# Patient Record
Sex: Female | Born: 1960 | Race: White | Hispanic: No | Marital: Married | Smoking: Never smoker
Health system: Southern US, Community
[De-identification: ages and names within clinical notes are randomized; demographics above are authoritative.]

## PROBLEM LIST (undated history)

## (undated) DIAGNOSIS — I10 Essential (primary) hypertension: Secondary | ICD-10-CM

## (undated) DIAGNOSIS — E079 Disorder of thyroid, unspecified: Secondary | ICD-10-CM

---

## 1999-08-05 ENCOUNTER — Encounter: Payer: Self-pay | Admitting: Family Medicine

## 1999-08-05 ENCOUNTER — Encounter: Admission: RE | Admit: 1999-08-05 | Discharge: 1999-08-05 | Payer: Self-pay | Admitting: Family Medicine

## 2000-11-13 ENCOUNTER — Other Ambulatory Visit: Admission: RE | Admit: 2000-11-13 | Discharge: 2000-11-13 | Payer: Self-pay | Admitting: Family Medicine

## 2002-02-22 ENCOUNTER — Encounter: Admission: RE | Admit: 2002-02-22 | Discharge: 2002-02-22 | Payer: Self-pay | Admitting: Orthopedic Surgery

## 2002-02-22 ENCOUNTER — Encounter: Payer: Self-pay | Admitting: Orthopedic Surgery

## 2002-03-08 ENCOUNTER — Encounter: Payer: Self-pay | Admitting: Orthopedic Surgery

## 2002-03-08 ENCOUNTER — Encounter: Admission: RE | Admit: 2002-03-08 | Discharge: 2002-03-08 | Payer: Self-pay | Admitting: Orthopedic Surgery

## 2002-03-22 ENCOUNTER — Encounter: Payer: Self-pay | Admitting: Orthopedic Surgery

## 2002-03-22 ENCOUNTER — Encounter: Admission: RE | Admit: 2002-03-22 | Discharge: 2002-03-22 | Payer: Self-pay | Admitting: Orthopedic Surgery

## 2002-11-20 ENCOUNTER — Encounter: Payer: Self-pay | Admitting: Orthopedic Surgery

## 2002-11-20 ENCOUNTER — Encounter: Admission: RE | Admit: 2002-11-20 | Discharge: 2002-11-20 | Payer: Self-pay | Admitting: Orthopedic Surgery

## 2002-12-06 ENCOUNTER — Encounter: Admission: RE | Admit: 2002-12-06 | Discharge: 2002-12-06 | Payer: Self-pay | Admitting: Orthopedic Surgery

## 2002-12-06 ENCOUNTER — Encounter: Payer: Self-pay | Admitting: Orthopedic Surgery

## 2002-12-20 ENCOUNTER — Encounter: Payer: Self-pay | Admitting: Orthopedic Surgery

## 2002-12-20 ENCOUNTER — Encounter: Admission: RE | Admit: 2002-12-20 | Discharge: 2002-12-20 | Payer: Self-pay | Admitting: Orthopedic Surgery

## 2003-02-28 ENCOUNTER — Other Ambulatory Visit: Admission: RE | Admit: 2003-02-28 | Discharge: 2003-02-28 | Payer: Self-pay | Admitting: Obstetrics and Gynecology

## 2003-12-19 ENCOUNTER — Encounter: Admission: RE | Admit: 2003-12-19 | Discharge: 2003-12-19 | Payer: Self-pay | Admitting: Orthopedic Surgery

## 2004-01-09 ENCOUNTER — Encounter: Admission: RE | Admit: 2004-01-09 | Discharge: 2004-01-09 | Payer: Self-pay | Admitting: Orthopedic Surgery

## 2004-01-23 ENCOUNTER — Encounter: Admission: RE | Admit: 2004-01-23 | Discharge: 2004-01-23 | Payer: Self-pay | Admitting: Orthopedic Surgery

## 2005-02-07 ENCOUNTER — Ambulatory Visit: Payer: Self-pay | Admitting: Family Medicine

## 2005-10-21 ENCOUNTER — Ambulatory Visit (HOSPITAL_COMMUNITY): Admission: RE | Admit: 2005-10-21 | Discharge: 2005-10-22 | Payer: Self-pay | Admitting: Orthopaedic Surgery

## 2006-03-07 ENCOUNTER — Ambulatory Visit: Payer: Self-pay

## 2007-01-23 ENCOUNTER — Other Ambulatory Visit: Admission: RE | Admit: 2007-01-23 | Discharge: 2007-01-23 | Payer: Self-pay | Admitting: Obstetrics and Gynecology

## 2007-02-08 ENCOUNTER — Ambulatory Visit: Payer: Self-pay | Admitting: Family Medicine

## 2007-02-08 DIAGNOSIS — F17200 Nicotine dependence, unspecified, uncomplicated: Secondary | ICD-10-CM

## 2007-03-08 ENCOUNTER — Ambulatory Visit: Payer: Self-pay

## 2007-03-08 ENCOUNTER — Encounter (INDEPENDENT_AMBULATORY_CARE_PROVIDER_SITE_OTHER): Payer: Self-pay | Admitting: Internal Medicine

## 2007-10-08 IMAGING — CR DG LUMBAR SPINE 1V
1 series · 1 of 1 positions shown · non-contrast
Comparison: None.

CLINICAL DATA: L5-S1 microdiscectomy for a left-sided disc herniation.

LUMBAR SPINE - SINGLE PORTABLE CROSS TABLE LATERAL VIEW

[view not recorded]
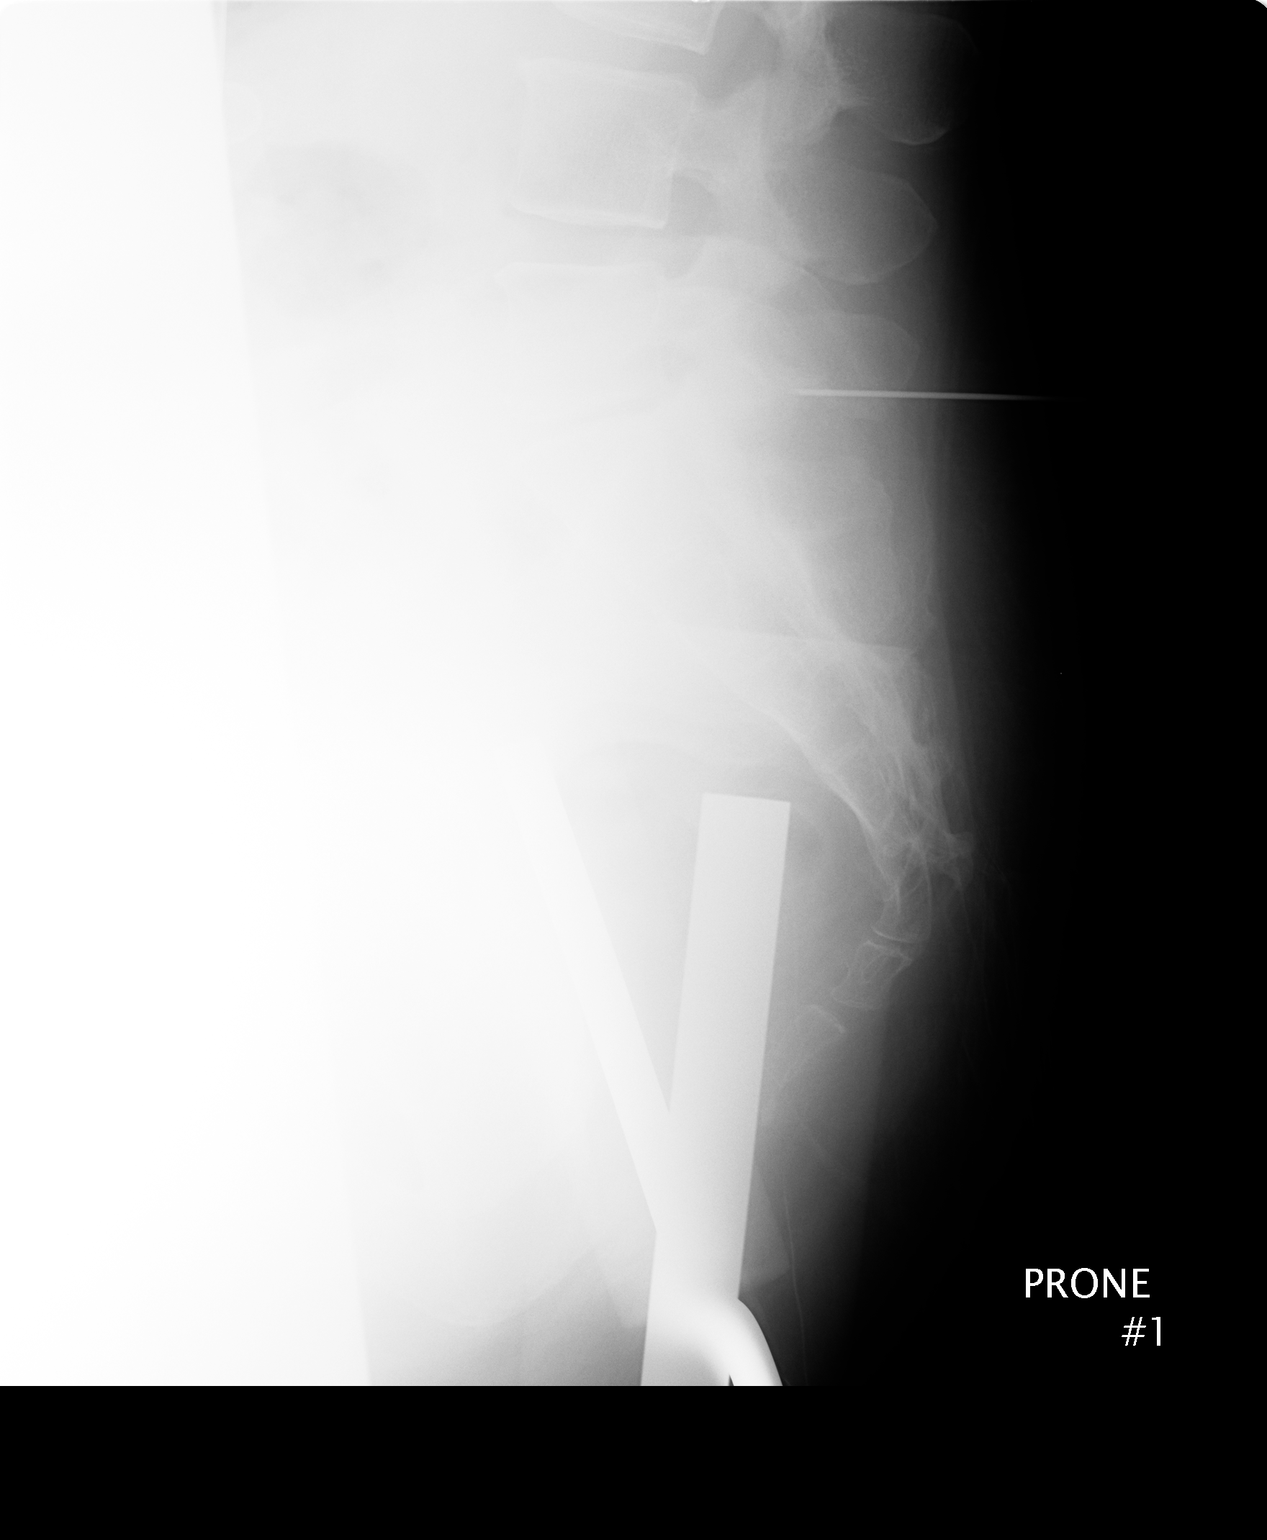

[1 of 1 positions shown; findings below may reference images not displayed]

FINDINGS: Metallic localizer with its tip projected at the posterior aspects of
the facets at the lumbosacral junction. This disc space is moderately to
markedly narrowed. Mild anterior spur formation in the lower lumbar spine.

IMPRESSION

Localizer at the level of a moderately to markedly narrowed disc space at the
lumbosacral junction. Correlation with previous studies would be necessary to
confirm that this is the L5-S1 level.

## 2008-01-24 ENCOUNTER — Other Ambulatory Visit: Admission: RE | Admit: 2008-01-24 | Discharge: 2008-01-24 | Payer: Self-pay | Admitting: Obstetrics and Gynecology

## 2009-11-23 ENCOUNTER — Encounter (INDEPENDENT_AMBULATORY_CARE_PROVIDER_SITE_OTHER): Payer: Self-pay | Admitting: *Deleted

## 2010-03-13 ENCOUNTER — Emergency Department: Payer: Self-pay | Admitting: Emergency Medicine

## 2010-05-20 NOTE — Letter (Signed)
Summary: Nadara Eaton letter  [place] at [facility]  [address] [place], [place] [address]   Phone: [phone]  Fax: [phone]       11/23/2009 MRN: [id number]  [facility] [address] [place], [place]  [address]  Dear Ms. [facility] [place] and [place] announce the retirement of Arta Silence, M.D., from full-time practice at the [facility] office effective October 15, 2009 and his plans of returning part-time.  It is important to Dr. Hetty Ely and to our practice that you understand that [facility] has seven physicians in our office for your health care needs.  We will continue to offer the same exceptional care that you have today.    Dr. Hetty Ely has spoken to many of you about his plans for retirement and returning part-time in the fall.   We will continue to work with you through the transition to schedule appointments for you in the office and meet the high standards that [facility] is committed to.   Again, it is with great pleasure that we share the news that Dr. Hetty Ely will return to [facility] in October of 2011 with a reduced schedule.    If you have any questions, or would like to request an appointment with one of our physicians, please call us at [phone] and press the option for Scheduling an appointment.  We take pleasure in providing you with excellent patient care and look forward to seeing you at your next office visit.  Our [facility] Physicians are:  Tillman Abide, M.D. Laurita Quint, M.D. Roxy Manns, M.D. Kerby Nora, M.D. Hannah Beat, M.D. Ruthe Mannan, M.D. We proudly welcomed Raechel Ache, M.D. and Eustaquio Boyden, M.D. to the practice in July/August 2011.  Sincerely,  [facility]

## 2010-09-03 NOTE — Op Note (Signed)
NAMEJERICHO, [name]              ACCOUNT NO.:  [id number]   MEDICAL RECORD NO.:  [id number]          PATIENT TYPE:  AMB   LOCATION:  SDS                          FACILITY:  [facility]   PHYSICIAN:  Mark C. Ophelia Charter, M.D.    DATE OF BIRTH:  [birth year]   DATE OF PROCEDURE:  10/21/2005  DATE OF DISCHARGE:                                 OPERATIVE REPORT   PREOPERATIVE DIAGNOSIS:  Left L5-S1 herniated nucleus pulposus with  radiculopathy.   POSTOPERATIVE DIAGNOSIS:  Left L5-S1 herniated nucleus pulposus with  radiculopathy.   PROCEDURE:  Left L5-S1 microdiskectomy.   SURGEON:  Mark C. Ophelia Charter, M.D.   ASSISTANT:  Patrick Jupiter, R.N.F.A.   ANESTHESIA:  GOT plus Marcaine skin local.   INDICATIONS FOR PROCEDURE:  This 50 year old female has been followed for  several years for left L5-S1 HNP with persistent symptoms.  She was  scheduled for surgery at one point more than a year ago and then had some  improvement in her symptoms, and surgical intervention was delayed.  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   DESCRIPTION OF PROCEDURE:  After induction of general anesthesia and  preoperative vancomycin, standard prepping with DuraPrep, the area was  covered with towels and Betadine and Vi-Drape applied.  Needle localization  with spinal needle.  Cross-table lateral x-ray confirmed the needle at the  L5-S1 disk space.   An incision was made a few millimeters to the left of the midline.  Subperiosteal dissection on the lamina with self-retaining retractor placed.  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  About two-thirds of  the lamina of L5 on the left had to be removed.  Bone was removed down to  the pedicle before the edge of the S1 lamina bone was found and then  gradually  moved from cephalad to caudad, revealing the dura and the nerve  root as it hooked around the pedicle and out the foramina.  The nerve root  was stuck down anteriorly to bulging disk.  The disk space itself was very  narrow and only allowed a micropituitary entry.  There was calcified disk  fragment present, and it could not be entered with the scalpel.  Microcurettes were used to pull up at the edge starting from cephalad to  caudad.  Once the edge was pulled up, a 10-mm Kerrison was inserted, and the  bone shell was nibbled away.  Underneath was disk herniation with protruding  disk underneath.  Epstein curettes had to be used to break the shell out at  the midline and push it into the narrowed disk space and then grasped with a  micropituitary.  Continued dissection continued until disk space was well-  decompressed.  The old calcified disk shell was removed, and the underlying  fragments were removed that extended caudally.  The disk space itself was  cleaned out with micropituitaries up and down  as well as straight.  There  was minimal disk material centrally remaining since most of it had extruded  underneath the old calcified fragment.  Once a hockey stick could be swept  underneath anteriorly, the foramina was opened.  The disk space was  irrigated.  The operative microscope that had been used for the  microdissection was removed.  Irrigation and closure of the fascia  with 0 Vicryl, 2-0 Vicryl, subcutaneous tissue reapproximation, and 4-0  Vicryl subcuticular closure.  Marcaine infiltration of the skin once again.  Postoperative dressing.   Transferred to the recovery room in stable condition.  Instrument count and  needle count was correct.      Mark C. Ophelia Charter, M.D.  Electronically Signed     MCY/MEDQ  D:  10/21/2005  T:  10/21/2005  Job:  [id number]

## 2013-11-04 ENCOUNTER — Ambulatory Visit: Payer: Self-pay | Admitting: Surgery

## 2013-11-13 ENCOUNTER — Ambulatory Visit: Payer: Self-pay | Admitting: Surgery

## 2013-11-15 LAB — PATHOLOGY REPORT

## 2014-03-17 ENCOUNTER — Ambulatory Visit: Payer: Self-pay | Admitting: Family Medicine

## 2014-08-09 NOTE — Op Note (Signed)
PATIENT NAME:  Amanda DeutscherHALL, Shunna K MR#:  [id number] DATE OF BIRTH:  08/20/1960  DATE OF PROCEDURE:  11/13/2013  SURGEON:  Cristal Deerhristopher A. Clelia Trabucco, MD.  PREOPERATIVE DIAGNOSIS: Symptomatic cholelithiasis.   POSTOPERATIVE DIAGNOSIS: Symptomatic cholelithiasis.   PROCEDURE PERFORMED: Laparoscopic da Vinci robot-assisted cholecystectomy.   ANESTHESIA: General endotracheal.   ESTIMATED BLOOD LOSS: 50 mL.   COMPLICATIONS: None.   SPECIMENS: Gallbladder.   INDICATION FOR SURGERY: [name] was brought to the operating room suite. She was induced. Endotracheal tube was placed, general anesthesia was administered. Her abdomen was prepped and draped in standard surgical fashion. A timeout was then performed correctly identifying the patient name, operative site and procedure to be performed. An infraumbilical incision was made. It was deepened down the fascia.  The fascia was incised. The peritoneum was entered. An 11 mm Hasson trocar was placed through the infraumbilical fascia.  The 8 mm da Vinci robot trocar was placed in the left upper quadrant approximately 5-7 cm below the costal margin at the midclavicular line. An additional 8 mm trocar was placed approximately 4 cm above the umbilicus in the right upper quadrant, approximately 5 cm below the right costal margin at 1 cm lateral to the midclavicular line.   A 5 mm right anterior axillary subcostal trocar was placed. The gallbladder was then lifted over the dome of liver.  Next, with the [employer] Vinci robot, the cystic artery and cystic duct were dissected out. The critical view was obtained. These structures were clipped 3 times and ligated. The gallbladder was then taken off the gallbladder fossa with hook electrocautery and brought out with an Endo Catch bag. The gallbladder fossa was then examined and noted to be hemostatic. The abdomen was irrigated. There was no obvious bleeding. The abdomen was desufflated. The infraumbilical trocar site was then  closed with a figure-of-eight 0 Vicryl. All port sites were then closed with 4-0 Monocryl deep dermal sutures.  Steri-Strips, Telfa gauze and Tegaderm were then used to complete the dressing. The patient was then awoken, extubated and brought to the postanesthesia care unit. There were no immediate complications. Needle, sponge, and instrument count were correct at the end of the procedure.    ____________________________ Si Raiderhristopher A. Dominik Yordy, MD cal:ds D: 11/15/2013 21:36:00 ET T: 11/15/2013 22:17:01 ET JOB#: [id number]  cc: Cristal Deerhristopher A. Raahil Ong, MD, <Dictator> Jarvis NewcomerHRISTOPHER A Aljean Horiuchi MD ELECTRONICALLY SIGNED 12/09/2013 21:01

## 2015-10-22 IMAGING — US US GALLBLADDER-BILIARY (RUQ)
2 series · 14 of 25 positions shown · non-contrast
Comparison: None.

CLINICAL DATA: Abdominal pain and nausea

EXAM:
US ABDOMEN LIMITED - RIGHT UPPER QUADRANT

[Series 1: us gallbladder-biliary (ruq) · 0.31mm/px · 13 of 42 slices shown (1 of 2)]
[im 1/42]
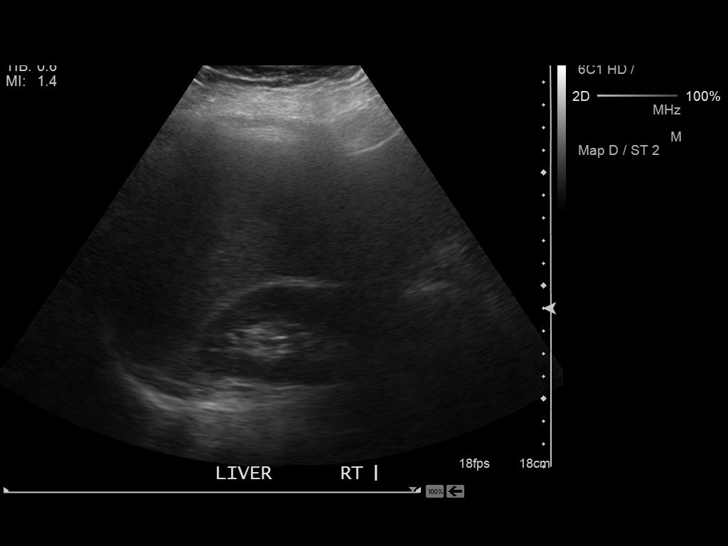
[im 4/42]
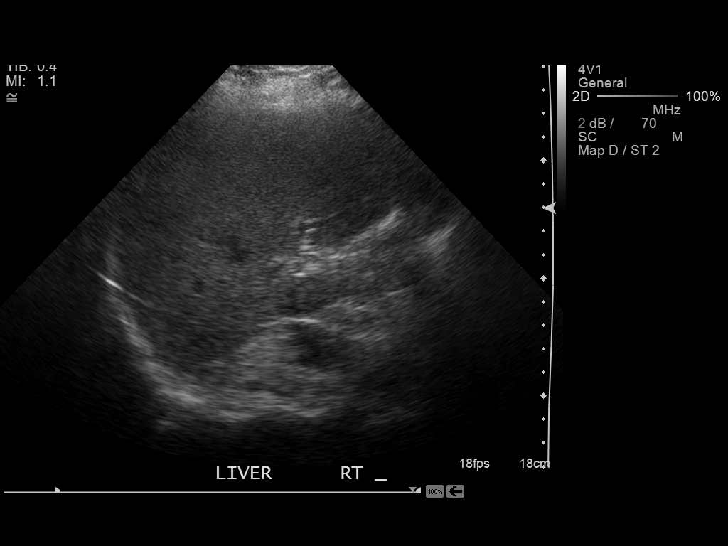
[im 8/42]
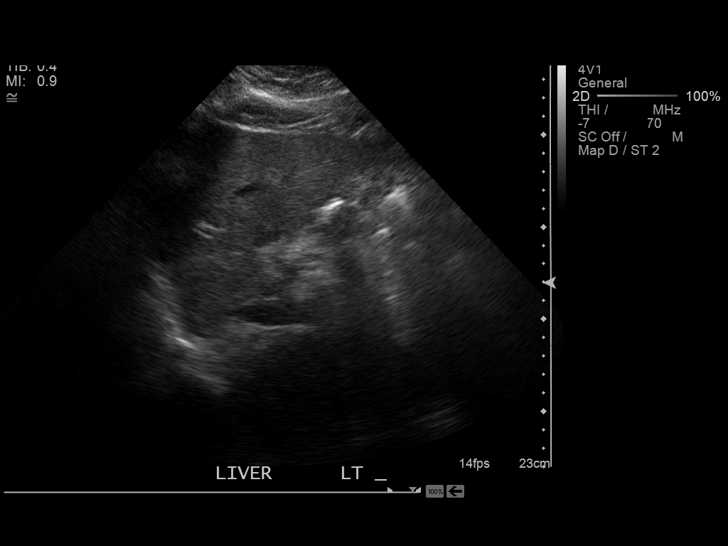
[im 11/42]
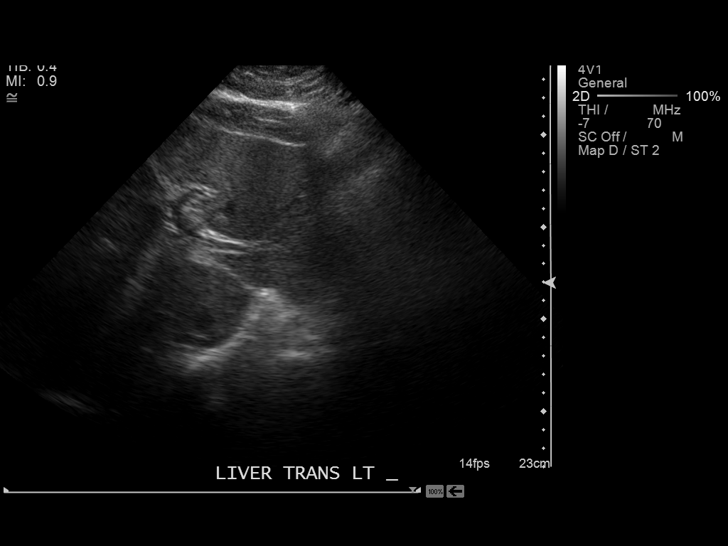
[im 15/42]
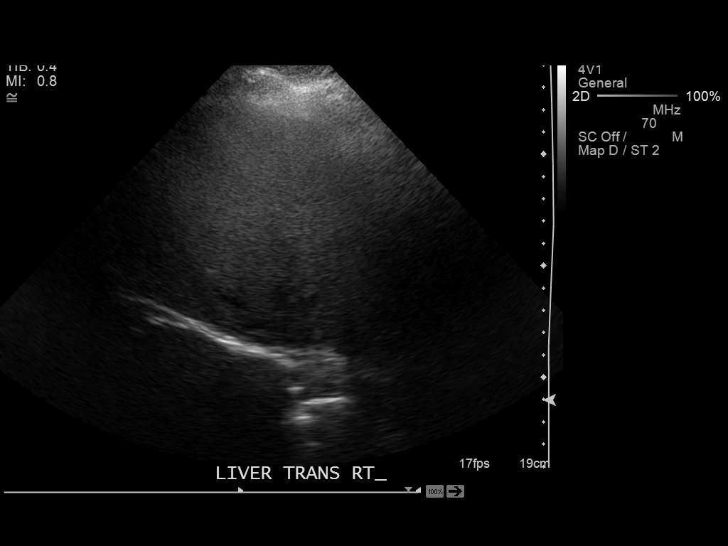
[im 17/42]
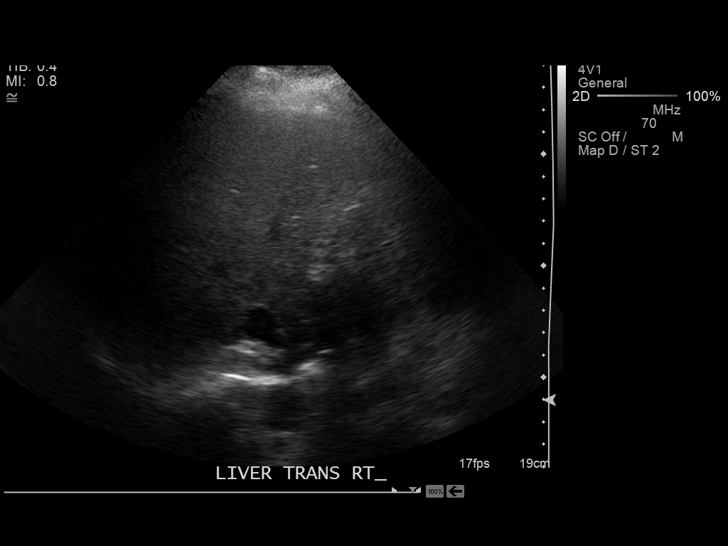
[im 20/42]
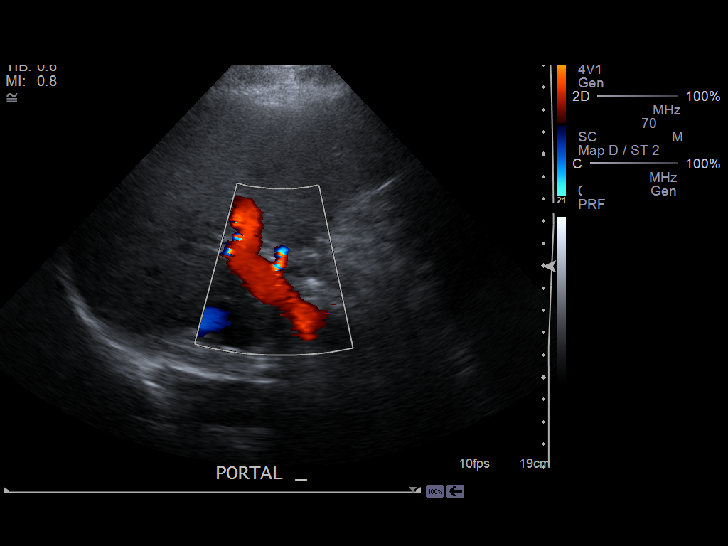
[im 24/42]
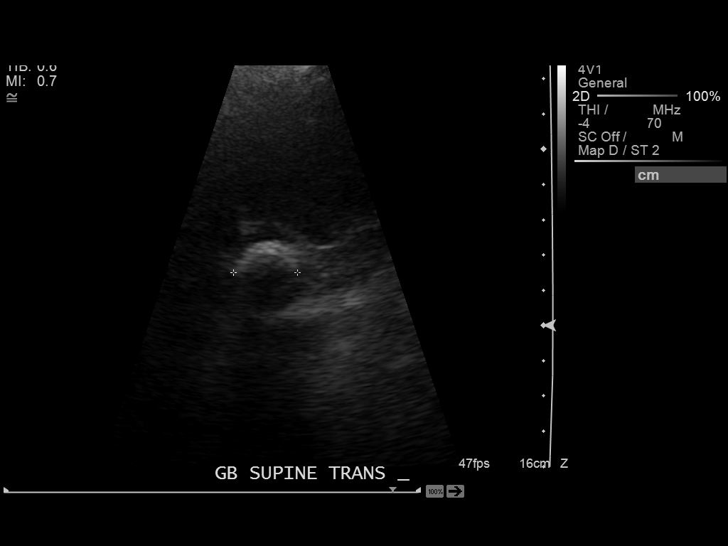
[im 27/42]
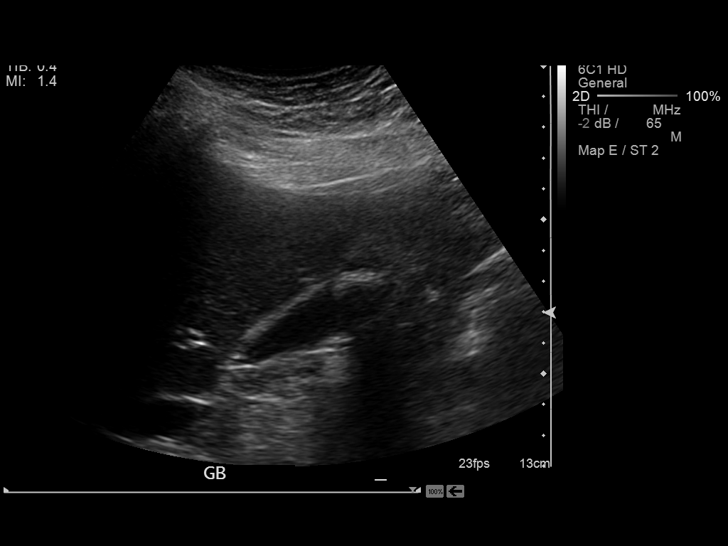
[im 29/42]
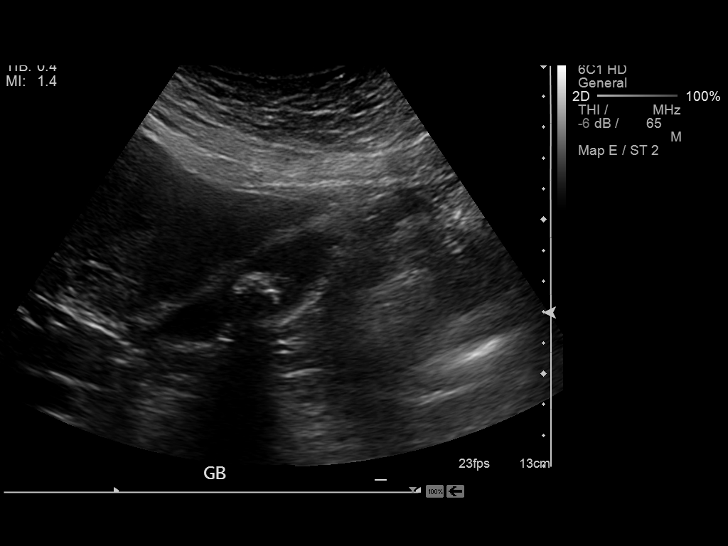
[im 33/42]
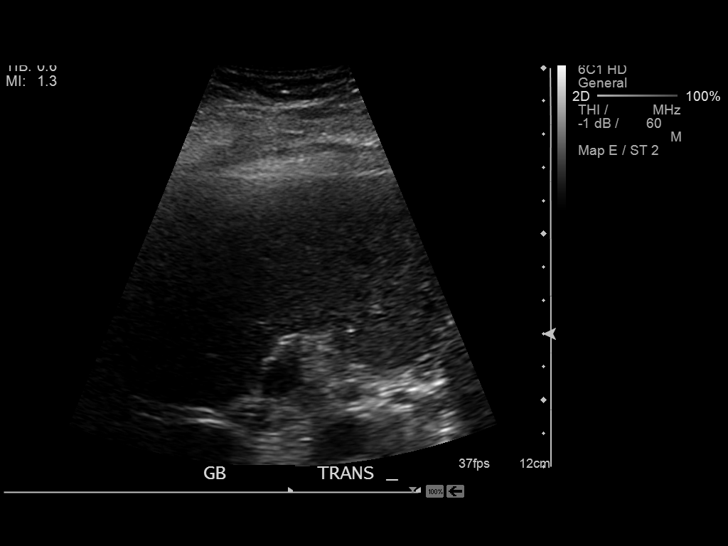
[im 36/42]
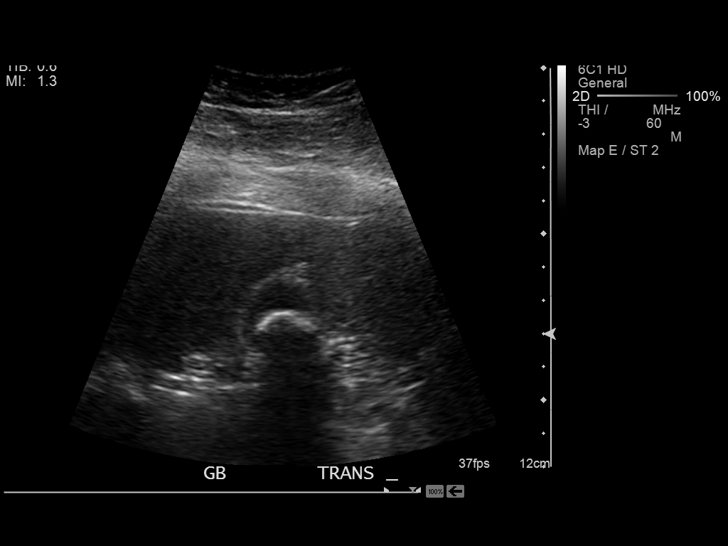
[im 40/42]
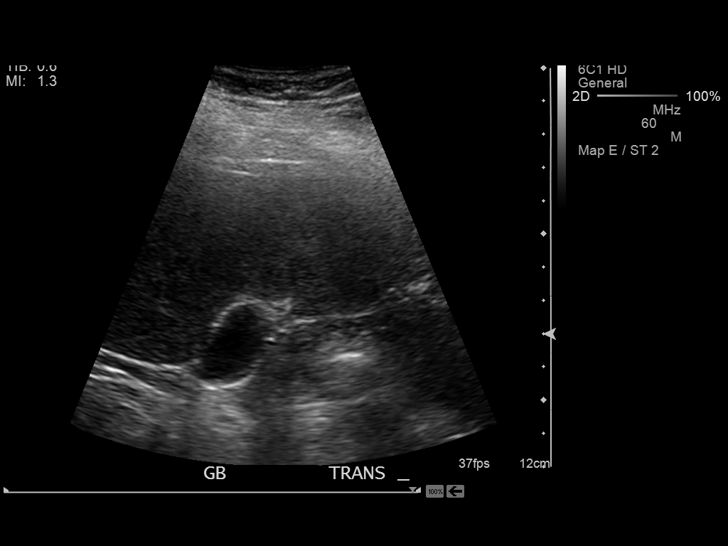

[Series 2001: us gallbladder-biliary (ruq) · 0.20mm/px · 1 of 1 slices shown (2 of 2)]
[im 1/1]
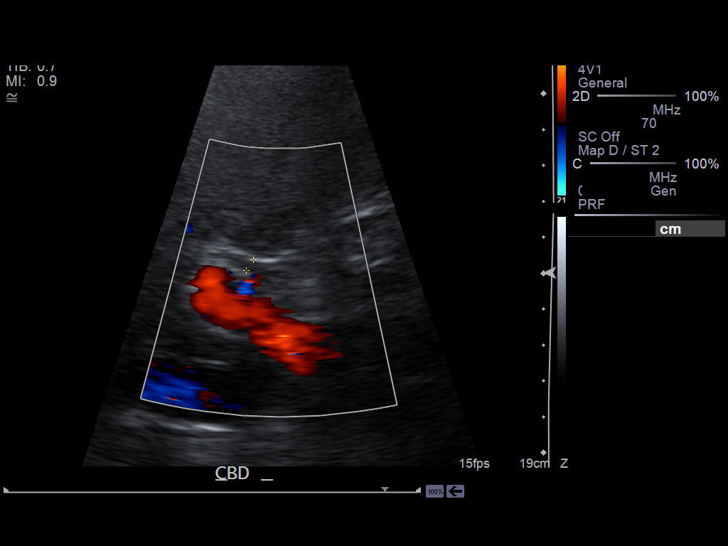

[14 of 25 positions shown; findings below may reference images not displayed]

FINDINGS: Gallbladder:

The gallbladder is only partially distended. There is a dominant
echogenic mobile shadowing stone. There may be multiple additional
stones. There is no gallbladder wall thickening, pericholecystic
fluid, or positive sonographic Murphy's sign.

Common bile duct:

Diameter: 3.6 mm

Liver:

The liver exhibits normal echotexture with no focal mass or ductal
dilation.
IMPRESSION: 1. Gallstone without sonographic evidence of acute cholecystitis.
2. The liver and common bile duct are normal.

## 2017-07-24 ENCOUNTER — Other Ambulatory Visit: Payer: Self-pay | Admitting: Family Medicine

## 2017-07-24 DIAGNOSIS — Z1231 Encounter for screening mammogram for malignant neoplasm of breast: Secondary | ICD-10-CM

## 2017-10-10 ENCOUNTER — Encounter: Payer: Self-pay | Admitting: Podiatry

## 2017-10-10 ENCOUNTER — Ambulatory Visit (INDEPENDENT_AMBULATORY_CARE_PROVIDER_SITE_OTHER): Payer: BLUE CROSS/BLUE SHIELD

## 2017-10-10 ENCOUNTER — Ambulatory Visit: Payer: BLUE CROSS/BLUE SHIELD | Admitting: Podiatry

## 2017-10-10 VITALS — BP 138/82 | HR 109 | Temp 98.5°F

## 2017-10-10 DIAGNOSIS — M7662 Achilles tendinitis, left leg: Secondary | ICD-10-CM

## 2017-10-10 DIAGNOSIS — M7661 Achilles tendinitis, right leg: Secondary | ICD-10-CM | POA: Diagnosis not present

## 2017-10-10 DIAGNOSIS — M722 Plantar fascial fibromatosis: Secondary | ICD-10-CM | POA: Diagnosis not present

## 2017-10-10 MED ORDER — METHYLPREDNISOLONE 4 MG PO TBPK: ORAL_TABLET | ORAL | 0 refills | Status: DC

## 2017-10-10 MED ORDER — MELOXICAM 15 MG PO TABS: 15.0000 mg | ORAL_TABLET | Freq: Every day | ORAL | 1 refills | Status: AC

## 2017-10-12 NOTE — Progress Notes (Signed)
   HPI: 57 year old female presenting today as a new patient with a chief complaint of bilateral plantar and posterior heel pain that began 3-6 months ago. She states the right is worse than the left. She notes a painful nodule to the posterior right heel that has been present for about 6 months. Walking and standing for long periods of time increases the pain. She has not done anything for treatment. Patient is here for further evaluation and treatment.   History reviewed. No pertinent past medical history.    Physical Exam: General: The patient is alert and oriented x3 in no acute distress.  Dermatology: Skin is warm, dry and supple bilateral lower extremities. Negative for open lesions or macerations.  Vascular: Palpable pedal pulses bilaterally. No edema or erythema noted. Capillary refill within normal limits.  Neurological: Epicritic and protective threshold grossly intact bilaterally.   Musculoskeletal Exam: Pain on palpation noted to the posterior tubercle of the bilateral calcaneus at the insertion of the Achilles tendon consistent with retrocalcaneal bursitis. Tenderness to palpation to the plantar aspect of the bilateral heels along the plantar fascia. Range of motion within normal limits. Muscle strength 5/5 in all muscle groups bilateral lower extremities.  Radiographic Exam:  Posterior calcaneal spur noted to the respective calcaneus on lateral view. No fracture or dislocation noted. Normal osseous mineralization noted.     Assessment: 1. Achilles tendinitis bilateral  2. Plantar fasciitis bilateral    Plan of Care:  1. Patient was evaluated. Radiographs were reviewed today. 2. Injection of 0.5 mL Celestone Soluspan injected into the retrocalcaneal bursa. Care was taken to avoid direct injection into the Achilles tendon. 3. Injection of 0.5 mLs Celestone Soluspan injected into the bilateral heels.  4. Prescription for Medrol Dose Pak provided to patient.  5. Prescription  for Meloxicam provided to patient.  6. Stressed importance of daily calf stretches.  7. Return to clinic in 4 weeks.   Works at [employer].    Felecia ShellingBrent M. Evans, DPM [facility]  Dr. Felecia ShellingBrent M. Evans, DPM    [address]                                        [place], [place] [address]                Office [phone]  Fax [phone](336) [phone]

## 2017-11-01 ENCOUNTER — Ambulatory Visit: Payer: BLUE CROSS/BLUE SHIELD | Admitting: Podiatry

## 2017-11-10 ENCOUNTER — Ambulatory Visit: Payer: BLUE CROSS/BLUE SHIELD | Admitting: Podiatry

## 2017-12-01 ENCOUNTER — Encounter: Payer: Self-pay | Admitting: Podiatry

## 2017-12-01 ENCOUNTER — Ambulatory Visit: Payer: BLUE CROSS/BLUE SHIELD | Admitting: Podiatry

## 2017-12-01 DIAGNOSIS — M7661 Achilles tendinitis, right leg: Secondary | ICD-10-CM

## 2017-12-01 DIAGNOSIS — M7662 Achilles tendinitis, left leg: Secondary | ICD-10-CM

## 2017-12-01 DIAGNOSIS — M722 Plantar fascial fibromatosis: Secondary | ICD-10-CM | POA: Diagnosis not present

## 2017-12-01 NOTE — Progress Notes (Signed)
   HPI: 57 year old female presenting today for follow up evaluation of bilateral plantar fasciitis and achilles tendinitis. She states her pain has improved about 50 percent. She has been wearing the fascial braces and taking Meloxicam as directed. There are no modifying factors noted. Patient is here for further evaluation and treatment.   No past medical history on file.    Physical Exam: General: The patient is alert and oriented x3 in no acute distress.  Dermatology: Skin is warm, dry and supple bilateral lower extremities. Negative for open lesions or macerations.  Vascular: Palpable pedal pulses bilaterally. No edema or erythema noted. Capillary refill within normal limits.  Neurological: Epicritic and protective threshold grossly intact bilaterally.   Musculoskeletal Exam: Pain on palpation noted to the posterior tubercle of the bilateral calcaneus at the insertion of the Achilles tendon consistent with retrocalcaneal bursitis. Tenderness to palpation to the plantar aspect of the bilateral heels along the plantar fascia. Range of motion within normal limits. Muscle strength 5/5 in all muscle groups bilateral lower extremities.  Assessment: 1. Achilles tendinitis bilateral - improved 2. Plantar fasciitis bilateral - improved    Plan of Care:  1. Patient was evaluated.  2. Injection of 0.5 mL Celestone Soluspan injected into the retrocalcaneal bursa of the right foot. Care was taken to avoid direct injection into the Achilles tendon. 3. Injection of 0.5 mLs Celestone Soluspan injected into the left heel.  4. Continue wearing plantar fascial braces.  5. Continue taking Meloxicam. 6. Return to clinic in 6 weeks.   Works at [employer].    Felecia ShellingBrent M. Lynton Crescenzo, DPM [facility]  Dr. Felecia ShellingBrent M. Vonn Sliger, DPM    [address]                                        [place], [place] [address]                Office [phone]  Fax [phone]

## 2018-01-12 ENCOUNTER — Encounter: Payer: Self-pay | Admitting: Podiatry

## 2018-01-12 ENCOUNTER — Ambulatory Visit: Payer: BLUE CROSS/BLUE SHIELD | Admitting: Podiatry

## 2018-01-12 DIAGNOSIS — M7662 Achilles tendinitis, left leg: Secondary | ICD-10-CM

## 2018-01-12 DIAGNOSIS — M722 Plantar fascial fibromatosis: Secondary | ICD-10-CM | POA: Diagnosis not present

## 2018-01-12 DIAGNOSIS — M7661 Achilles tendinitis, right leg: Secondary | ICD-10-CM

## 2018-01-12 MED ORDER — MELOXICAM 15 MG PO TABS: 15.0000 mg | ORAL_TABLET | Freq: Every day | ORAL | 1 refills | Status: AC

## 2018-01-14 NOTE — Progress Notes (Signed)
   HPI: 57 year old female presenting today for follow up evaluation of bilateral plantar fasciitis and achilles tendinitis.  Patient states that she is doing much better than last visit.  She would like one more anti-inflammatory injection to help alleviate her symptoms.  Patient patient otherwise has no new complaints at this time.  Plantar fascial braces and meloxicam are helping.  No past medical history on file.    Physical Exam: General: The patient is alert and oriented x3 in no acute distress.  Dermatology: Skin is warm, dry and supple bilateral lower extremities. Negative for open lesions or macerations.  Vascular: Palpable pedal pulses bilaterally. No edema or erythema noted. Capillary refill within normal limits.  Neurological: Epicritic and protective threshold grossly intact bilaterally.   Musculoskeletal Exam: Pain on palpation noted to the posterior tubercle of the right calcaneus at the insertion of the Achilles tendon consistent with retrocalcaneal bursitis. Tenderness to palpation to the plantar aspect of the left heel along the plantar fascia. Range of motion within normal limits. Muscle strength 5/5 in all muscle groups bilateral lower extremities.  Assessment: 1.  Insertional Achilles tendinitis left-resolved 2.  Insertional Achilles tendinitis right 3.  Plantar fasciitis right-resolved 4.  Plantar fasciitis left   Plan of Care:  1. Patient was evaluated.  2. Injection of 0.5 mL Celestone Soluspan injected into the retrocalcaneal bursa of the right foot. Care was taken to avoid direct injection into the Achilles tendon. 3. Injection of 0.5 mLs Celestone Soluspan injected into the left heel.  4. Continue wearing plantar fascial braces.  5. Continue taking Meloxicam.  Refill prescription provided 6.  Return to clinic as needed  Works at [employer].    Felecia Shelling, DPM [facility]  Dr. Felecia Shelling, DPM    [address]                                         [place], [place] [address]                Office [phone]  Fax [phone]

## 2018-03-09 ENCOUNTER — Ambulatory Visit: Payer: BLUE CROSS/BLUE SHIELD | Admitting: Podiatry

## 2018-03-12 ENCOUNTER — Telehealth: Payer: Self-pay | Admitting: Podiatry

## 2018-03-12 NOTE — Telephone Encounter (Signed)
Pt called and stated that her appointment was cx on Friday 03/09/2018 with Dr. Logan BoresEvans for Bil foot pain. She needs to speak to a nurse about what she needs to do in the meantime for her pain before he gives he another injection. Next available time for Dr. Logan BoresEvans is 04/13/2018. She is scheduled for 9:15. Please give patient a call.

## 2018-03-12 NOTE — Telephone Encounter (Signed)
Left message to call back  

## 2018-04-13 ENCOUNTER — Ambulatory Visit (INDEPENDENT_AMBULATORY_CARE_PROVIDER_SITE_OTHER): Payer: BLUE CROSS/BLUE SHIELD | Admitting: Podiatry

## 2018-04-13 ENCOUNTER — Encounter: Payer: Self-pay | Admitting: Podiatry

## 2018-04-13 DIAGNOSIS — M7661 Achilles tendinitis, right leg: Secondary | ICD-10-CM

## 2018-04-13 DIAGNOSIS — M7662 Achilles tendinitis, left leg: Secondary | ICD-10-CM

## 2018-04-15 NOTE — Progress Notes (Signed)
   HPI: 57 year old female presenting today for follow up evaluation of bilateral foot pain. She reports intermittent pain with some days worse than others. She states the braces have not really helped improve her pain. She states the left foot pain has almost completely resolved and the pain is located mainly in the right foot. Patient is here for further evaluation and treatment.   History reviewed. No pertinent past medical history.    Physical Exam: General: The patient is alert and oriented x3 in no acute distress.  Dermatology: Skin is warm, dry and supple bilateral lower extremities. Negative for open lesions or macerations.  Vascular: Palpable pedal pulses bilaterally. No edema or erythema noted. Capillary refill within normal limits.  Neurological: Epicritic and protective threshold grossly intact bilaterally.   Musculoskeletal Exam: Pain on palpation noted to the posterior tubercle of the right calcaneus at the insertion of the Achilles tendon consistent with retrocalcaneal bursitis. Range of motion within normal limits. Muscle strength 5/5 in all muscle groups bilateral lower extremities.  Assessment: 1. Insertional Achilles tendinitis right  2. Retrocalcaneal bursitis   Plan of Care:  1. Patient was evaluated.  2. Injection of 0.5 mL Celestone Soluspan injected into the retrocalcaneal bursa. Care was taken to avoid direct injection into the Achilles tendon. 3. Discontinue taking Meloxicam. Patient states it does not help.  4. Discussed physical therapy as an option/next step for treatment.  5. Recommended good shoe gear.  6. Return to clinic as needed.    Felecia ShellingBrent M. Evans, DPM [facility]  Dr. Felecia ShellingBrent M. Evans, DPM    [address]                                        [place], [place] [address]                Office [phone]) [phone]  Fax [phone]

## 2018-09-11 ENCOUNTER — Ambulatory Visit (INDEPENDENT_AMBULATORY_CARE_PROVIDER_SITE_OTHER): Payer: Managed Care, Other (non HMO)

## 2018-09-11 ENCOUNTER — Other Ambulatory Visit: Payer: Self-pay

## 2018-09-11 ENCOUNTER — Encounter: Payer: Self-pay | Admitting: Podiatry

## 2018-09-11 ENCOUNTER — Telehealth: Payer: Self-pay | Admitting: Podiatry

## 2018-09-11 ENCOUNTER — Ambulatory Visit (INDEPENDENT_AMBULATORY_CARE_PROVIDER_SITE_OTHER): Payer: BLUE CROSS/BLUE SHIELD | Admitting: Podiatry

## 2018-09-11 ENCOUNTER — Other Ambulatory Visit: Payer: Self-pay | Admitting: Podiatry

## 2018-09-11 VITALS — Temp 97.8°F

## 2018-09-11 DIAGNOSIS — M79671 Pain in right foot: Secondary | ICD-10-CM

## 2018-09-11 DIAGNOSIS — B351 Tinea unguium: Secondary | ICD-10-CM | POA: Diagnosis not present

## 2018-09-11 DIAGNOSIS — M7661 Achilles tendinitis, right leg: Secondary | ICD-10-CM

## 2018-09-11 DIAGNOSIS — M7662 Achilles tendinitis, left leg: Secondary | ICD-10-CM | POA: Diagnosis not present

## 2018-09-11 DIAGNOSIS — Z79899 Other long term (current) drug therapy: Secondary | ICD-10-CM | POA: Diagnosis not present

## 2018-09-11 DIAGNOSIS — M79672 Pain in left foot: Secondary | ICD-10-CM

## 2018-09-11 MED ORDER — TERBINAFINE HCL 250 MG PO TABS: 250.0000 mg | ORAL_TABLET | Freq: Every day | ORAL | 0 refills | Status: DC

## 2018-09-11 NOTE — Patient Instructions (Signed)
Pre-Operative Instructions  Congratulations, you have decided to take an important step towards improving your quality of life.  You can be assured that the doctors and staff at [facility] will be with you every step of the way.  Here are some important things you should know:  1. Plan to be at the surgery center/hospital at least 1 (one) hour prior to your scheduled time, unless otherwise directed by the surgical center/hospital staff.  You must have a responsible adult accompany you, remain during the surgery and drive you home.  Make sure you have directions to the surgical center/hospital to ensure you arrive on time. 2. If you are having surgery at [facility] or [facility], you will need a copy of your medical history and physical form from your family physician within one month prior to the date of surgery. We will give you a form for your primary physician to complete.  3. We make every effort to accommodate the date you request for surgery.  However, there are times where surgery dates or times have to be moved.  We will contact you as soon as possible if a change in schedule is required.   4. No aspirin/ibuprofen for one week before surgery.  If you are on aspirin, any non-steroidal anti-inflammatory medications (Mobic, Aleve, Ibuprofen) should not be taken seven (7) days prior to your surgery.  You make take Tylenol for pain prior to surgery.  5. Medications - If you are taking daily heart and blood pressure medications, seizure, reflux, allergy, asthma, anxiety, pain or diabetes medications, make sure you notify the surgery center/hospital before the day of surgery so they can tell you which medications you should take or avoid the day of surgery. 6. No food or drink after midnight the night before surgery unless directed otherwise by surgical center/hospital staff. 7. No alcoholic beverages 24-hours prior to surgery.  No smoking 24-hours prior or 24-hours after  surgery. 8. Wear loose pants or shorts. They should be loose enough to fit over bandages, boots, and casts. 9. Don't wear slip-on shoes. Sneakers are preferred. 10. Bring your boot with you to the surgery center/hospital.  Also bring crutches or a walker if your physician has prescribed it for you.  If you do not have this equipment, it will be provided for you after surgery. 11. If you have not been contacted by the surgery center/hospital by the day before your surgery, call to confirm the date and time of your surgery. 12. Leave-time from work may vary depending on the type of surgery you have.  Appropriate arrangements should be made prior to surgery with your employer. 13. Prescriptions will be provided immediately following surgery by your doctor.  Fill these as soon as possible after surgery and take the medication as directed. Pain medications will not be refilled on weekends and must be approved by the doctor. 14. Remove nail polish on the operative foot and avoid getting pedicures prior to surgery. 15. Wash the night before surgery.  The night before surgery wash the foot and leg well with water and the antibacterial soap provided. Be sure to pay special attention to beneath the toenails and in between the toes.  Wash for at least three (3) minutes. Rinse thoroughly with water and dry well with a towel.  Perform this wash unless told not to do so by your physician.  Enclosed: 1 Ice pack (please put in freezer the night before surgery)   1 Hibiclens skin cleaner     Pre-op instructions  If you have any questions regarding the instructions, please do not hesitate to call our office.  [place]: [address], [place], [place] [address] -- [phone]  [place]: [address] [place], [place] [address] -- [phone]  [place]: [address]  , [place] [address] -- [phone]  [place]: [address], [place], [place] [address] -- [phone]  Website:  [email or web address] 

## 2018-09-11 NOTE — Telephone Encounter (Signed)
Pt called saying she signed her consent forms today and wanted to schedule her surgery. I told her once I had her forms here I would call her back and get her scheduled. I went ahead and let her know that it would not be until July probably that we could get her scheduled.

## 2018-09-12 LAB — HEPATIC FUNCTION PANEL
ALT: 9 IU/L (ref 0–32)
AST: 13 IU/L (ref 0–40)
Albumin: 4.2 g/dL (ref 3.8–4.9)
Alkaline Phosphatase: 87 IU/L (ref 39–117)
Bilirubin Total: <0.2 mg/dL (ref 0.0–1.2)
Bilirubin, Direct: 0.06 mg/dL (ref 0.00–0.40)
Total Protein: 6.4 g/dL (ref 6.0–8.5)

## 2018-09-12 NOTE — Telephone Encounter (Signed)
Called pt and got her scheduled for surgery the afternoon of Friday 05 June. I told her she could go ahead and register online and that they would call 24-48 hours before her surgery to let her know what time to arrive. I told pt to call with any questions she may have.

## 2018-09-13 NOTE — Telephone Encounter (Signed)
Left voicemail to move surgery from 05 June to 04 June per [facility] request due to their schedules.

## 2018-09-13 NOTE — Progress Notes (Signed)
   HPI: 58 year old female presenting today for follow up evaluation of insertional Achilles tendinitis of the right foot. She states her pain has not improved since her previous visit. She has taken Meloxicam and received injections with no significant improvement. Being on her foot for long periods of time increases the pain.  She also notes a new complaint of possible nail fungus of the left hallux that has been present for the past few months. She notes associated thickening and discoloration of the nail. She has not done anything for treatment and denies modifying factors. Patient is here for further evaluation and treatment.   History reviewed. No pertinent past medical history.    Physical Exam: General: The patient is alert and oriented x3 in no acute distress.  Dermatology: Hyperkeratotic, discolored, thickened, onychodystrophy of the left great toenail. Skin is warm, dry and supple bilateral lower extremities. Negative for open lesions or macerations.  Vascular: Palpable pedal pulses bilaterally. No edema or erythema noted. Capillary refill within normal limits.  Neurological: Epicritic and protective threshold grossly intact bilaterally.   Musculoskeletal Exam: Pain on palpation noted to the posterior tubercle of the right calcaneus at the insertion of the Achilles tendon consistent with retrocalcaneal bursitis. Range of motion within normal limits. Muscle strength 5/5 in all muscle groups bilateral lower extremities.  Radiographic Exam:  Normal osseous mineralization. Joint spaces preserved. No fracture/dislocation/boney destruction.    Assessment: 1. Insertional Achilles tendinitis right  2. Retrocalcaneal bursitis 3. Onychomycosis left hallux    Plan of Care:  1. Patient was evaluated. X-Rays reviewed.  2. 2. Today we discussed the conservative versus surgical management of the presenting pathology. The patient opts for surgical management. All possible complications and  details of the procedure were explained. All patient questions were answered. No guarantees were expressed or implied. 3. Authorization for surgery was initiated today. Surgery will consist of retrocalcaneal exostectomy with repair of Achilles tendon right.  4. Liver function test ordered.  5. Prescription for Lamisil 250 mg #90 provided to patient.  6. CAM boot dispensed.  7. Return to clinic one week post op.   She and her daughter work at [employer]. Daughter was furloughed through September.    Felecia Shelling, DPM [facility]  Dr. Felecia Shelling, DPM    [address]                                        [place], [place] [address]                Office [phone]  Fax [phone]

## 2018-09-13 NOTE — Telephone Encounter (Signed)
Pt called back and confirmed she can have surgery on 04 June instead of 05 June. I will update in one medical passport.

## 2018-09-19 ENCOUNTER — Telehealth: Payer: Self-pay | Admitting: *Deleted

## 2018-09-19 NOTE — Telephone Encounter (Signed)
DOS 09/20/2018, CPT CODE: 20802 - ACHILLES TENDON REPAIR, 23361 - CALCANEAL OSTECTOMY RIGHT FOOT  [employer]: Effective Date - 04/18/18  Deductible - $1500 ind. / $4500 family Co-insurance - 80% / 20%  AUTHORIZATIONS NOT REQUIRED FOR [id number] and [id number] Confirmation Numbers for the inquiries: [phone] and [phone]

## 2018-09-20 DIAGNOSIS — M7661 Achilles tendinitis, right leg: Secondary | ICD-10-CM | POA: Diagnosis not present

## 2018-09-20 DIAGNOSIS — M216X1 Other acquired deformities of right foot: Secondary | ICD-10-CM | POA: Diagnosis not present

## 2018-09-24 ENCOUNTER — Other Ambulatory Visit: Payer: Self-pay | Admitting: Podiatry

## 2018-09-24 MED ORDER — OXYCODONE-ACETAMINOPHEN 5-325 MG PO TABS: 1.0000 | ORAL_TABLET | Freq: Four times a day (QID) | ORAL | 0 refills | Status: DC | PRN

## 2018-09-24 NOTE — Progress Notes (Signed)
.  postop

## 2018-09-27 ENCOUNTER — Other Ambulatory Visit: Payer: Self-pay

## 2018-09-27 ENCOUNTER — Ambulatory Visit (INDEPENDENT_AMBULATORY_CARE_PROVIDER_SITE_OTHER): Payer: Managed Care, Other (non HMO) | Admitting: Podiatry

## 2018-09-27 ENCOUNTER — Ambulatory Visit (INDEPENDENT_AMBULATORY_CARE_PROVIDER_SITE_OTHER): Payer: Managed Care, Other (non HMO)

## 2018-09-27 VITALS — Temp 98.0°F

## 2018-09-27 DIAGNOSIS — Z09 Encounter for follow-up examination after completed treatment for conditions other than malignant neoplasm: Secondary | ICD-10-CM

## 2018-09-27 DIAGNOSIS — Z9889 Other specified postprocedural states: Secondary | ICD-10-CM

## 2018-09-27 DIAGNOSIS — M7661 Achilles tendinitis, right leg: Secondary | ICD-10-CM

## 2018-09-27 DIAGNOSIS — M79676 Pain in unspecified toe(s): Secondary | ICD-10-CM

## 2018-09-27 NOTE — Progress Notes (Addendum)
This patient presents to the office following foot surgery performed on September 20, 2018 by Dr. Amalia Hailey.  Patient had a calcaneal ostectomy and a reattachment of the Achilles tendon right foot.  She presents the office today wearing a cast stating she is not having much pain or discomfort.  She says the cast is causing mild pain but she is not experiencing severe pain.  Patient is up walking with a scooter due to her cast.  She presents the office today for an further evaluation of her foot following her surgery.  Neurovascular status is intact with this patient.  Patient has capillary return within normal limits.  The cast that was applied at the surgical center is intact and not causing any problems for this patient.   Right foot surgery  POV # 1. Her xrays reveal bony resection in calcaneus and presence of staples.  Discussed this condition with this patient.  She feels she is doing very well getting around and having minimal problems with her cast.  Patient has no history of chills or fever.   RTC 1 week for continued follow up tratment for this patient.   Gardiner Barefoot DPM

## 2018-10-03 ENCOUNTER — Telehealth: Payer: Self-pay

## 2018-10-03 NOTE — Telephone Encounter (Signed)
Patient called requesting a refill of Oxycodone.  If you approve, please send to [facility].  Thanks

## 2018-10-04 ENCOUNTER — Other Ambulatory Visit: Payer: Self-pay | Admitting: Podiatry

## 2018-10-04 ENCOUNTER — Encounter: Payer: Self-pay | Admitting: Podiatry

## 2018-10-04 MED ORDER — OXYCODONE-ACETAMINOPHEN 5-325 MG PO TABS: 1.0000 | ORAL_TABLET | Freq: Four times a day (QID) | ORAL | 0 refills | Status: DC | PRN

## 2018-10-04 NOTE — Progress Notes (Signed)
Postop PRN pain

## 2018-10-04 NOTE — Telephone Encounter (Signed)
Rx sent to [facility] 

## 2018-10-05 ENCOUNTER — Ambulatory Visit (INDEPENDENT_AMBULATORY_CARE_PROVIDER_SITE_OTHER): Payer: Managed Care, Other (non HMO) | Admitting: Podiatry

## 2018-10-05 ENCOUNTER — Other Ambulatory Visit: Payer: Self-pay

## 2018-10-05 VITALS — Temp 96.4°F

## 2018-10-05 DIAGNOSIS — Z9889 Other specified postprocedural states: Secondary | ICD-10-CM

## 2018-10-05 DIAGNOSIS — M7661 Achilles tendinitis, right leg: Secondary | ICD-10-CM

## 2018-10-05 MED ORDER — OXYCODONE-ACETAMINOPHEN 5-325 MG PO TABS: 1.0000 | ORAL_TABLET | Freq: Four times a day (QID) | ORAL | 0 refills | Status: DC | PRN

## 2018-10-07 NOTE — Progress Notes (Signed)
   Subjective:  Patient presents today status post retrocalcaneal exostectomy right. DOS: 09/20/2018. She states she is improving. She reports some intermittent pain but denies modifying factors. She has been taking Percocet for her symptoms. Patient is here for further evaluation and treatment.   No past medical history on file.    Objective/Physical Exam Neurovascular status intact.  Skin incisions appear to be well coapted with sutures and staples intact. No sign of infectious process noted. No dehiscence. No active bleeding noted. Moderate edema noted to the surgical extremity.  Assessment: 1. s/p retrocalcaneal exostectomy right. DOS: 09/20/2018   Plan of Care:  1. Patient was evaluated.  2. Cast removed. Dry sterile dressing applied.  3. Continue nonweightbearing in CAM boot. Patient has CAM boot from home.  4. Refill prescription for Percocet 5/325 mg #30 provided to patient.  5. Return to clinic in 2 weeks for suture removal.    Edrick Kins, DPM [facility]  Dr. Edrick Kins, [address]                                        [place], [place] [address]                Office [phone]  Fax [phone]

## 2018-10-23 ENCOUNTER — Other Ambulatory Visit: Payer: Self-pay

## 2018-10-23 ENCOUNTER — Ambulatory Visit (INDEPENDENT_AMBULATORY_CARE_PROVIDER_SITE_OTHER): Payer: Managed Care, Other (non HMO) | Admitting: Podiatry

## 2018-10-23 ENCOUNTER — Ambulatory Visit (INDEPENDENT_AMBULATORY_CARE_PROVIDER_SITE_OTHER): Payer: Managed Care, Other (non HMO)

## 2018-10-23 ENCOUNTER — Encounter: Payer: Self-pay | Admitting: Podiatry

## 2018-10-23 DIAGNOSIS — Z9889 Other specified postprocedural states: Secondary | ICD-10-CM

## 2018-10-23 DIAGNOSIS — M722 Plantar fascial fibromatosis: Secondary | ICD-10-CM | POA: Diagnosis not present

## 2018-10-25 NOTE — Progress Notes (Signed)
   Subjective:  Patient presents today status post retrocalcaneal exostectomy right. DOS: 09/20/2018. She states she is doing well. She denies any significant pain or modifying factors. She has been nonweightbearing in the CAM boot as directed. Patient is here for further evaluation and treatment.   No past medical history on file.    Objective/Physical Exam Neurovascular status intact.  Skin incisions appear to be well coapted with sutures and staples intact. No sign of infectious process noted. No dehiscence. No active bleeding noted. Moderate edema noted to the surgical extremity.  Radiographic Exam:  Osteotomies sites appear to be stable with routine healing.  Assessment: 1. s/p retrocalcaneal exostectomy right. DOS: 09/20/2018   Plan of Care:  1. Patient was evaluated. X-Rays reviewed.  2. Sutures/staples removed.  3. Begin weightbearing in CAM boot.  4. Return to clinic in 4 weeks.   Sister just passed away today.    Edrick Kins, DPM [facility]  Dr. Edrick Kins, [place]                                        [place], [place] [address]                Office [phone]  Fax [phone]

## 2018-11-20 ENCOUNTER — Encounter: Payer: Self-pay | Admitting: Podiatry

## 2018-11-20 ENCOUNTER — Other Ambulatory Visit: Payer: Self-pay

## 2018-11-20 ENCOUNTER — Ambulatory Visit (INDEPENDENT_AMBULATORY_CARE_PROVIDER_SITE_OTHER): Payer: Managed Care, Other (non HMO)

## 2018-11-20 ENCOUNTER — Ambulatory Visit (INDEPENDENT_AMBULATORY_CARE_PROVIDER_SITE_OTHER): Payer: Managed Care, Other (non HMO) | Admitting: Podiatry

## 2018-11-20 VITALS — Temp 97.2°F

## 2018-11-20 DIAGNOSIS — M7661 Achilles tendinitis, right leg: Secondary | ICD-10-CM

## 2018-11-20 DIAGNOSIS — Z9889 Other specified postprocedural states: Secondary | ICD-10-CM

## 2018-11-22 ENCOUNTER — Telehealth: Payer: Self-pay

## 2018-11-22 DIAGNOSIS — M7661 Achilles tendinitis, right leg: Secondary | ICD-10-CM

## 2018-11-22 DIAGNOSIS — Z9889 Other specified postprocedural states: Secondary | ICD-10-CM

## 2018-11-22 NOTE — Telephone Encounter (Signed)
-----   Message from Edrick Kins, DPM sent at 11/20/2018 10:06 AM EDT ----- Regarding: Physical therapy Please order Physical Therapy 2x/week x 6 weeks  Dx: s/p achilles repair with heel spur resection RT. DOS: 09/20/2018  Thanks, Dr. Amalia Hailey

## 2018-11-22 NOTE — Progress Notes (Signed)
   Subjective:  Patient presents today status post retrocalcaneal exostectomy right. DOS: 09/20/2018. She states she is doing well. She reports some intermittent sharp, shooting pain. She denies modifying factors and has been using the CAM boot and knee scooter as directed. Patient is here for further evaluation and treatment.   No past medical history on file.    Objective/Physical Exam Neurovascular status intact.  Skin incisions appear to be well coapted. No sign of infectious process noted. No dehiscence. No active bleeding noted. Moderate edema noted to the surgical extremity.  Radiographic Exam:  Osteotomies sites appear to be stable with routine healing.  Assessment: 1. s/p retrocalcaneal exostectomy right. DOS: 09/20/2018   Plan of Care:  1. Patient was evaluated. X-Rays reviewed.  2. Discontinue using knee scooter.  3. Continue weightbearing in CAM boot.  4. Orders for physical therapy twice weekly for four weeks placed.  5. Expected return to work in two month for today.  6. Return to clinic in 4 weeks.   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     Edrick Kins, DPM [facility]  Dr. Edrick Kins, [address]                                        [place], [place] [address]                Office [phone]  Fax [phone]

## 2018-11-27 ENCOUNTER — Ambulatory Visit: Payer: Managed Care, Other (non HMO) | Attending: Podiatry

## 2018-11-27 ENCOUNTER — Other Ambulatory Visit: Payer: Self-pay

## 2018-11-27 DIAGNOSIS — R262 Difficulty in walking, not elsewhere classified: Secondary | ICD-10-CM | POA: Diagnosis present

## 2018-11-27 DIAGNOSIS — M25571 Pain in right ankle and joints of right foot: Secondary | ICD-10-CM | POA: Diagnosis not present

## 2018-11-27 NOTE — Therapy (Signed)
[facility] [address] [place], [place], [id number] Phone: [phone]   Fax:  [phone]  Physical Therapy Evaluation  Patient Details  Name: [name] MRN: [id number] Date of Birth: January 04, 1961 Referring Provider (PT): Gala LewandowskyBrent Evans, DPM   Encounter Date: 11/27/2018  PT End of Session - 11/27/18 1657    Visit Number  1    Number of Visits  13    Date for PT Re-Evaluation  01/10/19    PT Start Time  1657   pt arrived late   PT Stop Time  1746    PT Time Calculation (min)  49 min    Activity Tolerance  Patient tolerated treatment well    Behavior During Therapy  [facility] for tasks assessed/performed       No past medical history on file.  No past surgical history on file.  There were no vitals filed for this visit.   Subjective Assessment - 11/27/18 1701    Subjective  R heel/foot: 0/10 pain currently but has severe tenderness and discomfort (bothers her when it is touched. Fine when not touched),  7/10 at worst (usually at night when she goes to bed).    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 Prior to surgery, pt had heel and foot pain for a year. Was treated conservatively with stretches and injections which did not help. Used a knee scooter until 11/20/2018 which was discharged by her surgeon. Uses a rw to feel steady. Next follow up is on 12/25/2018.  Going to the beach 12/14/2018 for about a week. Pt said that her doctor told her to take her boot with her and for her to also wear New Balance or Asics if she is walking on sand. Ordered TEVA sandles for her to wear in the water.  Has not yet had PT for her procedure. Currently not working after the surgery. Works at [employer]. 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 No heavy lifting.    Patient Stated Goals  Get back to being able to get around. Be able to walk longer distances so she can get back to work.     Currently in Pain?  No/denies    Pain Score  0-No pain    Pain Location  Foot    Pain Orientation  Right    Pain Descriptors / Indicators  Sore   discomfort   Pain Type  Chronic pain;Surgical pain    Pain Onset  More than a month ago    Pain Frequency  Occasional    Aggravating Factors   end of the day, pressure, first thing in the morning    Pain Relieving Factors  No known factors which make it feel better.         [facility] Assessment - 11/27/18 1659      Assessment   Medical Diagnosis  S/P R foot surgery; Achilles Tendinitis of R LE     Referring Provider (PT)  Gala LewandowskyBrent Evans, DPM    Onset Date/Surgical Date  09/20/18    Next MD Visit  12/25/2018    Prior Therapy  None since surgery      Precautions   Precaution Comments  CAM boot when on R foot.       Restrictions   Other Position/Activity Restrictions  CAM boot when on R foot      Balance Screen   Has the patient fallen in the  past 6 months  No    Has the patient had a decrease in activity level because of a fear of falling?   No    Is the patient reluctant to leave their home because of a fear of falling?   No      Home Environment   Additional Comments  Pt lives in a 1 story home with husband. Has a ramp at the side door.       Prior Function   Vocation  Full time employment   [employer]; involves desk job and prolonged walking   [employer]  PLOF: difficulty walking long distances      Observation/Other Assessments   Observations  incision healed well; slight swelling R ankle.       AROM   Right Ankle Dorsiflexion  1      Strength   Right Hip Flexion  5/5    Right Hip Extension  4/5    Right Hip ABduction  4/5    Left Hip Flexion  5/5    Left Hip Extension  4/5    Left Hip ABduction  4/5    Right Knee Flexion  5/5    Right Knee Extension  5/5    Left Knee Flexion  5/5    Left Knee Extension  5/5      Palpation   Palpation comment  decreased scar tissue mobility      Ambulation/Gait   Gait  Comments  No AD: Wears CAM boot R foot. Antalgic, decreased stance R LE, lateral lean.                 Objective measurements completed on examination: See above findings.   No known latex band allergies  Next MD appointment 12/25/2018   Therapeutic Exercise Supine R ankle DF to neutral 10x3  Improved exercise technique, movement at target joints, use of target muscles after mod verbal, visual, tactile cues.    Manual therapy  STM scar tissue to decrease stiffness, promote gentle movement.  No tenderness per pt.      Patient is a 58 year old female who came to physical therapy S/P R retrocalcaneal exostectomy on 09/20/2018. Pt is currently 9.5 weeks post op. She currently presents with limited L ankle AROM, slight swelling, altered gait pattern, decreased scar tissue mobility, bilateral glute med and max weakness, and difficulty performing tasks which involve standing and walking. Pt will benefit from skilled physical therapy to address the aforementioned deficits.      PT Education - 11/27/18 1816    Education Details  gentle massage at scar tissue area    Person(s) Educated  Patient    Methods  Explanation;Demonstration;Verbal cues    Comprehension  Verbalized understanding       PT Short Term Goals - 11/27/18 1759      PT SHORT TERM GOAL #1   Title  Patient wil be independent with her HEP to improve glute strength, ankle ROM, and function.    Time  3    Period  Weeks    Status  New    Target Date  12/20/18        PT Long Term Goals - 11/27/18 1800      PT LONG TERM GOAL #1   Title  Patient will be able to ambulate at least 100 ft without CAM boot and no AD, wearing tennis shoes without LOB independently to promote mobility.    Baseline  Pt currently able to ambulate short  distances with CAM boot (11/27/2018).    Time  6    Period  Weeks    Status  New    Target Date  01/10/19      PT LONG TERM GOAL #2   Title  Pt will improve bilateral hip abduction  and extension strength by at least 1/2 MMT to promote ability to ambulate with good mechanics at her feet.    Baseline  Hip abduction: 4/5 R and L, hip extension: 4/5 R and L (11/27/2018)    Time  6    Period  Weeks    Status  New    Target Date  01/10/19      PT LONG TERM GOAL #3   Title  Patient will improve R ankle DF AROM to at least 15 degrees to promote foot clearance during swing phase of gait.    Baseline  R ankle DF AROM 1 degree (11/27/2018)    Time  6    Period  Weeks    Status  New    Target Date  01/10/19             Plan - 11/27/18 1810    Clinical Impression Statement  Patient is a 58 year old female who came to physical therapy S/P R retrocalcaneal exostectomy on 09/20/2018. Pt is currently 9.5 weeks post op. She currently presents with limited L ankle AROM, slight swelling, altered gait pattern, decreased scar tissue mobility, bilateral glute med and max weakness, and difficulty performing tasks which involve standing and walking. Pt will benefit from skilled physical therapy to address the aforementioned deficits.    Personal Factors and Comorbidities  Age    Examination-Activity Limitations  Squat;Stairs;Carry;Locomotion Level    Stability/Clinical Decision Making  Stable/Uncomplicated    Clinical Decision Making  Low    Rehab Potential  Good    PT Frequency  2x / week    PT Duration  6 weeks    PT Treatment/Interventions  Therapeutic exercise;Neuromuscular re-education;Therapeutic activities;Gait training;Stair training;Functional mobility training;Balance training;Patient/family education;Manual techniques;Scar mobilization;Dry needling;Aquatic Therapy;Electrical Stimulation;Iontophoresis 4mg /ml Dexamethasone;Ultrasound    PT Next Visit Plan  glute med and max strengthening, femoral control, manual techniques, modalities PRN    Consulted and Agree with Plan of Care  Patient       Patient will benefit from skilled therapeutic intervention in order to improve the  following deficits and impairments:  Pain, Postural dysfunction, Improper body mechanics, Difficulty walking, Decreased strength, Decreased scar mobility, Decreased range of motion, Decreased activity tolerance, Abnormal gait  Visit Diagnosis: 1. Pain in right ankle and joints of right foot   2. Difficulty in walking, not elsewhere classified        Problem List Patient Active Problem List   Diagnosis Date Noted  . Surgery follow-up examination 09/27/2018  . TOBACCO ABUSE 02/08/2007    Joneen Boers PT, DPT   11/27/2018, 6:26 PM  [facility] [address], [place], [address] Phone: [phone]   Fax:  [phone]  Name: [name] MRN: [id number] Date of Birth: [birth year]

## 2018-11-27 NOTE — Patient Instructions (Signed)
Pt was recommended to gently massage scar tissue area to decrease adhesions. Pt verbalized understanding.

## 2018-11-28 ENCOUNTER — Ambulatory Visit: Payer: Managed Care, Other (non HMO)

## 2018-12-03 ENCOUNTER — Telehealth: Payer: Self-pay | Admitting: Podiatry

## 2018-12-03 NOTE — Telephone Encounter (Signed)
Patient is requesting a refill of Oxycodone to take at night time to help rest.  Please send in rx if you approve.  Thanks!

## 2018-12-03 NOTE — Telephone Encounter (Signed)
Pt request a refill of pain Rx.

## 2018-12-04 ENCOUNTER — Other Ambulatory Visit: Payer: Self-pay | Admitting: Podiatry

## 2018-12-04 ENCOUNTER — Ambulatory Visit: Payer: Managed Care, Other (non HMO)

## 2018-12-04 ENCOUNTER — Other Ambulatory Visit: Payer: Self-pay

## 2018-12-04 DIAGNOSIS — M25571 Pain in right ankle and joints of right foot: Secondary | ICD-10-CM | POA: Diagnosis not present

## 2018-12-04 DIAGNOSIS — R262 Difficulty in walking, not elsewhere classified: Secondary | ICD-10-CM

## 2018-12-04 MED ORDER — OXYCODONE-ACETAMINOPHEN 5-325 MG PO TABS: 1.0000 | ORAL_TABLET | Freq: Four times a day (QID) | ORAL | 0 refills | Status: AC | PRN

## 2018-12-04 NOTE — Patient Instructions (Addendum)
Medbridge Access Code: [email or web address]  S/L hip abduction 10x3 each side  Pone hip extension with knee bent 10x2 each side

## 2018-12-04 NOTE — Therapy (Signed)
[facility] [address] [place], [place], [address] Phone: [phone]   Fax:  [phone]  Physical Therapy Treatment  Patient Details  Name: [name] Jungwirth MRN: [id number] Date of Birth: [birth year] Referring Provider (PT): Gala LewandowskyBrent Evans, DPM   Encounter Date: 12/04/2018  PT End of Session - 12/04/18 0906    Visit Number  2    Number of Visits  13    Date for PT Re-Evaluation  01/10/19    PT Start Time  0906    PT Stop Time  0947    PT Time Calculation (min)  41 min    Activity Tolerance  Patient tolerated treatment well    Behavior During Therapy  [facility] for tasks assessed/performed       No past medical history on file.  No past surgical history on file.  There were no vitals filed for this visit.  Subjective Assessment - 12/04/18 0908    Subjective  R heel is somewhat better. At night, it wants to keep her from sleeping well. Does not seem to bother her when she is up and moving around. Has been sleeping with the boot off (MD said she could). No pain currently. Was ok after last session. A little tender but not excruciating.    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 Prior to surgery, pt had heel and foot pain for a year. Was treated conservatively with stretches and injections which did not help. Used a knee scooter until 11/20/2018 which was discharged by her surgeon. Uses a rw to feel steady. Next follow up is on 12/25/2018.  Going to the beach 12/14/2018 for about a week. Pt said that her doctor told her to take her boot with her and for her to also wear New Balance or Asics if she is walking on sand. Ordered TEVA sandles for her to wear in the water.  Has not yet had PT for her procedure. Currently not working after the surgery. Works at [employer]. 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 No heavy lifting.    Patient Stated Goals  Get back to being able to  get around. Be able to walk longer distances so she can get back to work.    Currently in Pain?  No/denies    Pain Score  0-No pain    Pain Onset  More than a month ago                               PT Education - 12/04/18 0909    Education Details  ther-ex    Person(s) Educated  Patient    Methods  Explanation;Demonstration;Tactile cues;Verbal cues    Comprehension  Returned demonstration;Verbalized understanding        Objective   No known latex band allergies  Next MD appointment 12/25/2018    Medbridge Access Code: [email or web address]  Therapeutic Exercise  Supine R ankle DF to neutral 10x3  Supine R ankle DF isometrics at neutral gently 10x5 seconds for 3 sets  S/L hip abduction  R 10x3  L 10x3   Prone glute max extension   R 10x2  L 10x2  Reviewed HEP. Pt demonstrated and verbalized understanding. Handout provided.    Seated hip ER   R 10x3  L 10x3  Seated hip adduction pillow and glute max squeeze 10x5 seconds for 2 sets  Improved exercise technique, movement at target joints, use of target muscles after mod verbal, visual, tactile cues.    Manual therapy  Gentle STM scar tissue to decrease stiffness, promote gentle movement.    Gentle STM plantar fascia and around heel   Response to treatment Good hip muscle use felt with exercises. No complain of pain throughout session.    Clinical impression Continued working on gentle STM at scar tissue, as well as plantar fascia to promote movement and decrease stiffness. Worked on hip strengthening to promote proper mechanics at foot when able to ambulate without boot. Pt tolerated session well without aggravation of symptoms. Pt will benefit from continued skilled physical therapy services to improve ROM, strength, function and ability to ambulate.       PT Short Term Goals - 11/27/18 1759      PT SHORT TERM GOAL #1   Title  Patient wil be independent with her HEP to improve  glute strength, ankle ROM, and function.    Time  3    Period  Weeks    Status  New    Target Date  12/20/18        PT Long Term Goals - 11/27/18 1800      PT LONG TERM GOAL #1   Title  Patient will be able to ambulate at least 100 ft without CAM boot and no AD, wearing tennis shoes without LOB independently to promote mobility.    Baseline  Pt currently able to ambulate short distances with CAM boot (11/27/2018).    Time  6    Period  Weeks    Status  New    Target Date  01/10/19      PT LONG TERM GOAL #2   Title  Pt will improve bilateral hip abduction and extension strength by at least 1/2 MMT to promote ability to ambulate with good mechanics at her feet.    Baseline  Hip abduction: 4/5 R and L, hip extension: 4/5 R and L (11/27/2018)    Time  6    Period  Weeks    Status  New    Target Date  01/10/19      PT LONG TERM GOAL #3   Title  Patient will improve R ankle DF AROM to at least 15 degrees to promote foot clearance during swing phase of gait.    Baseline  R ankle DF AROM 1 degree (11/27/2018)    Time  6    Period  Weeks    Status  New    Target Date  01/10/19            Plan - 12/04/18 1830    Clinical Impression Statement  Continued working on gentle STM at scar tissue, as well as plantar fascia to promote movement and decrease stiffness. Worked on hip strengthening to promote proper mechanics at foot when able to ambulate without boot. Pt tolerated session well without aggravation of symptoms. Pt will benefit from continued skilled physical therapy services to improve ROM, strength, function and ability to ambulate.    Personal Factors and Comorbidities  Age    Examination-Activity Limitations  Squat;Stairs;Carry;Locomotion Level    Stability/Clinical Decision Making  Stable/Uncomplicated    Rehab Potential  Good    PT Frequency  2x / week    PT Duration  6 weeks    PT Treatment/Interventions  Therapeutic exercise;Neuromuscular re-education;Therapeutic  activities;Gait training;Stair training;Functional mobility training;Balance training;Patient/family education;Manual techniques;Scar mobilization;Dry needling;Aquatic Therapy;Electrical Stimulation;Iontophoresis 4mg /ml Dexamethasone;Ultrasound  PT Next Visit Plan  glute med and max strengthening, femoral control, manual techniques, modalities PRN    Consulted and Agree with Plan of Care  Patient       Patient will benefit from skilled therapeutic intervention in order to improve the following deficits and impairments:  Pain, Postural dysfunction, Improper body mechanics, Difficulty walking, Decreased strength, Decreased scar mobility, Decreased range of motion, Decreased activity tolerance, Abnormal gait  Visit Diagnosis: 1. Pain in right ankle and joints of right foot   2. Difficulty in walking, not elsewhere classified        Problem List Patient Active Problem List   Diagnosis Date Noted  . Surgery follow-up examination 09/27/2018  . TOBACCO ABUSE 02/08/2007     Loralyn FreshwaterMiguel Gerome Kokesh PT, DPT   12/04/2018, 6:39 PM  [place] [facility] [address] [place], [place], [address] Phone: [phone]   Fax:  [phone]  Name: [name] Salton MRN: [id number] Date of Birth: [birth year]

## 2018-12-06 ENCOUNTER — Other Ambulatory Visit: Payer: Self-pay

## 2018-12-06 ENCOUNTER — Ambulatory Visit: Payer: Managed Care, Other (non HMO)

## 2018-12-06 DIAGNOSIS — M25571 Pain in right ankle and joints of right foot: Secondary | ICD-10-CM | POA: Diagnosis not present

## 2018-12-06 DIAGNOSIS — R262 Difficulty in walking, not elsewhere classified: Secondary | ICD-10-CM

## 2018-12-06 NOTE — Therapy (Signed)
[place] [facility] [address] [place], [address] Phone: [phone]   Fax:  [phone]  Physical Therapy Treatment  Patient Details  Name: [name] MRN: [id number] Date of Birth: [birth year] Referring Provider (PT): Daylene Katayama, DPM   Encounter Date: 12/06/2018  PT End of Session - 12/06/18 1349    Visit Number  3    Number of Visits  13    Date for PT Re-Evaluation  01/10/19    PT Start Time  3295    PT Stop Time  1433    PT Time Calculation (min)  44 min    Activity Tolerance  Patient tolerated treatment well    Behavior During Therapy  [facility] for tasks assessed/performed       No past medical history on file.  No past surgical history on file.  There were no vitals filed for this visit.  Subjective Assessment - 12/06/18 1350    Subjective  R foot is fine currently. Did ok after last session. Was a little sore yestreday. The tenderness is better.    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 Prior to surgery, pt had heel and foot pain for a year. Was treated conservatively with stretches and injections which did not help. Used a knee scooter until 11/20/2018 which was discharged by her surgeon. Uses a rw to feel steady. Next follow up is on 12/25/2018.  Going to the beach 12/14/2018 for about a week. Pt said that her doctor told her to take her boot with her and for her to also wear New Balance or Asics if she is walking on sand. Ordered TEVA sandles for her to wear in the water.  Has not yet had PT for her procedure. Currently not working after the surgery. 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 No heavy lifting.    Patient Stated Goals  Get back to being able to get around. Be able to walk longer distances so she can get back to work.    Currently in Pain?  No/denies    Pain Score  0-No pain    Pain Onset  More than a month ago                               PT Education - 12/06/18 1406    Education Details  ther-ex    [employer]) Educated  Patient    Methods  Explanation;Demonstration;Tactile cues;Verbal cues    Comprehension  Returned demonstration;Verbalized understanding      Objective   No known latex band allergies  Next MD appointment 12/25/2018    Medbridge Access Code: [email or web address]     Manual therapy  Gentle STM scar tissue to decrease stiffness, promote gentle movement.   Gentle STM plantar fascia and around heel     Therapeutic Exercise  With CAM boot on  Forward step up onto Air Ex pad   With L UE assist 10x  Then no UE assist 10x2  Lateral step up onto Air Ex pad with L UE assist 10x2  SLS on R LE with L tip toe assist with 1 kg ball toss 20x2  Then without CAM boot     Supine R ankle DF to neutral 10x3  Supine R ankle DF isometrics at neutral gently 10x5 seconds for 3 sets  S/L hip abduction  R 10x            Prone glute max extension              R 10x                 Improved exercise technique, movement at target joints, use of target muscles after min to mod verbal, visual, tactile cues.       Response to treatment Good hip muscle use felt with exercises. No complain of pain throughout session.    Clinical impression Improving R scar tissue area sensitivity based on subjective reports. Continued working on scar tissue and plantar fascia gentle soft tissue mobilization to promote movement. Continued working on hip and LE strengthening to promote ability to support herself with her R LE when performing standing tasks. Pt tolerated session well without complain of pain. Pt will benefit from continued skilled physical therapy services to improve ROM, strength, function, and ability to ambulate.         PT Short Term Goals - 11/27/18 1759      PT SHORT TERM GOAL #1   Title  Patient wil be  independent with her HEP to improve glute strength, ankle ROM, and function.    Time  3    Period  Weeks    Status  New    Target Date  12/20/18        PT Long Term Goals - 11/27/18 1800      PT LONG TERM GOAL #1   Title  Patient will be able to ambulate at least 100 ft without CAM boot and no AD, wearing tennis shoes without LOB independently to promote mobility.    Baseline  Pt currently able to ambulate short distances with CAM boot (11/27/2018).    Time  6    Period  Weeks    Status  New    Target Date  01/10/19      PT LONG TERM GOAL #2   Title  Pt will improve bilateral hip abduction and extension strength by at least 1/2 MMT to promote ability to ambulate with good mechanics at her feet.    Baseline  Hip abduction: 4/5 R and L, hip extension: 4/5 R and L (11/27/2018)    Time  6    Period  Weeks    Status  New    Target Date  01/10/19      PT LONG TERM GOAL #3   Title  Patient will improve R ankle DF AROM to at least 15 degrees to promote foot clearance during swing phase of gait.    Baseline  R ankle DF AROM 1 degree (11/27/2018)    Time  6    Period  Weeks    Status  New    Target Date  01/10/19            Plan - 12/06/18 1406    Clinical Impression Statement  Improving R scar tissue area sensitivity based on subjective reports. Continued working on scar tissue and plantar fascia gentle soft tissue mobilization to promote movement. Continued working on hip and LE strengthening to promote ability to support herself with her R LE when performing standing tasks. Pt tolerated session well without complain of pain. Pt will benefit from continued skilled physical therapy services to improve ROM, strength, function, and ability to ambulate.    Personal Factors and Comorbidities  Age    Examination-Activity Limitations  Squat;Stairs;Carry;Locomotion Level    Stability/Clinical Decision  Making  Stable/Uncomplicated    Rehab Potential  Good    PT Frequency  2x / week    PT  Duration  6 weeks    PT Treatment/Interventions  Therapeutic exercise;Neuromuscular re-education;Therapeutic activities;Gait training;Stair training;Functional mobility training;Balance training;Patient/family education;Manual techniques;Scar mobilization;Dry needling;Aquatic Therapy;Electrical Stimulation;Iontophoresis 4mg /ml Dexamethasone;Ultrasound    PT Next Visit Plan  glute med and max strengthening, femoral control, manual techniques, modalities PRN    Consulted and Agree with Plan of Care  Patient       Patient will benefit from skilled therapeutic intervention in order to improve the following deficits and impairments:  Pain, Postural dysfunction, Improper body mechanics, Difficulty walking, Decreased strength, Decreased scar mobility, Decreased range of motion, Decreased activity tolerance, Abnormal gait  Visit Diagnosis: Pain in right ankle and joints of right foot  Difficulty in walking, not elsewhere classified     Problem List Patient Active Problem List   Diagnosis Date Noted  . Surgery follow-up examination 09/27/2018  . TOBACCO ABUSE 02/08/2007    Loralyn FreshwaterMiguel Lemon Whitacre PT, DPT   12/06/2018, 2:45 PM  [facility] [address] [place], [place], [address] Phone: [phone]   Fax:  [phone]  Name: [name] MRN: [id number] Date of Birth: [birth year]

## 2018-12-11 ENCOUNTER — Other Ambulatory Visit: Payer: Self-pay

## 2018-12-11 ENCOUNTER — Ambulatory Visit: Payer: Managed Care, Other (non HMO)

## 2018-12-11 DIAGNOSIS — M25571 Pain in right ankle and joints of right foot: Secondary | ICD-10-CM

## 2018-12-11 DIAGNOSIS — R262 Difficulty in walking, not elsewhere classified: Secondary | ICD-10-CM

## 2018-12-11 NOTE — Therapy (Signed)
[place] [facility] [facility] [address] [place], [address] Phone: [phone]   Fax:  [phone]  Physical Therapy Treatment  Patient Details  Name: [name] MRN: [id number] Date of Birth: 1961/03/01 Referring Provider (PT): Gala Lewandowsky, DPM   Encounter Date: 12/11/2018  PT End of Session - 12/11/18 0950    Visit Number  4    Number of Visits  13    Date for PT Re-Evaluation  01/10/19    PT Start Time  0947    PT Stop Time  1030    PT Time Calculation (min)  43 min    Activity Tolerance  Patient tolerated treatment well    Behavior During Therapy  [facility] for tasks assessed/performed       History reviewed. No pertinent past medical history.  History reviewed. No pertinent surgical history.  There were no vitals filed for this visit.  Subjective Assessment - 12/11/18 0949    Subjective  Patient reported that her R foot is tender, that it is her posterior heel. Stated she is compliant with her HEP.    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 Prior to surgery, pt had heel and foot pain for a year. Was treated conservatively with stretches and injections which did not help. Used a knee scooter until 11/20/2018 which was discharged by her surgeon. Uses a rw to feel steady. Next follow up is on 12/25/2018.  Going to the beach 12/14/2018 for about a week. Pt said that her doctor told her to take her boot with her and for her to also wear New Balance or Asics if she is walking on sand. Ordered TEVA sandles for her to wear in the water.  Has not yet had PT for her procedure. Currently not working after the surgery. Works at [employer]. 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 No heavy lifting.    Patient Stated Goals  Get back to being able to get around. Be able to walk longer distances so she can get back to work.    Currently in Pain?  No/denies    Pain Onset  More than a  month ago       Objective    No known latex band allergies   Next MD appointment 12/25/2018       Medbridge Access Code: [email or web address]         Manual therapy   Gentle STM scar tissue to decrease stiffness, promote gentle movement.     Gentle STM plantar fascia and around heel        Therapeutic Exercise   With CAM boot on   Forward step up onto Air Ex pad              With L UE assist 10x             Then no UE assist 20x   Lateral step up onto Air Ex pad without  UE assist x20   SLS on R LE with L tip toe assist with 2 kg ball toss x30   Then without CAM boot      Long sitting R ankle DF to neutral 10x3   Long sitting R ankle DF isometrics at neutral gently 10x5 seconds for 3 sets  R Ankle circles CW x30  R Ankle circles CCW x30   R Long sitting inversion/eversion AROM x30  R long sitting inversion/eversion isometric 10x5sec holds  Improved exercise technique, movement at target joints, use of target muscles after min to mod verbal, visual, tactile cues.    Pt response/clinical impression: Patient reported manual therapy "felt good", pt had decreased tissue mobility at distal end of scar. The patient reported no pain with addition of further AROM/isometrics of R foot.The patient would benefit from further skilled PT intervention to continue to progress towards goals as able.    PT Education - 12/11/18 0949    Education Details  ther-ex    Person(s) Educated  Patient    Methods  Explanation;Demonstration;Tactile cues;Verbal cues    Comprehension  Verbalized understanding;Returned demonstration       PT Short Term Goals - 11/27/18 1759      PT SHORT TERM GOAL #1   Title  Patient wil be independent with her HEP to improve glute strength, ankle ROM, and function.    Time  3    Period  Weeks    Status  New    Target Date  12/20/18        PT Long Term Goals - 11/27/18 1800      PT LONG TERM GOAL #1   Title  Patient will be able to  ambulate at least 100 ft without CAM boot and no AD, wearing tennis shoes without LOB independently to promote mobility.    Baseline  Pt currently able to ambulate short distances with CAM boot (11/27/2018).    Time  6    Period  Weeks    Status  New    Target Date  01/10/19      PT LONG TERM GOAL #2   Title  Pt will improve bilateral hip abduction and extension strength by at least 1/2 MMT to promote ability to ambulate with good mechanics at her feet.    Baseline  Hip abduction: 4/5 R and L, hip extension: 4/5 R and L (11/27/2018)    Time  6    Period  Weeks    Status  New    Target Date  01/10/19      PT LONG TERM GOAL #3   Title  Patient will improve R ankle DF AROM to at least 15 degrees to promote foot clearance during swing phase of gait.    Baseline  R ankle DF AROM 1 degree (11/27/2018)    Time  6    Period  Weeks    Status  New    Target Date  01/10/19            Plan - 12/11/18 1025    Clinical Impression Statement  Patient reported manual therapy "felt good", pt had decreased tissue mobility at distal end of scar. The patient reported no pain with addition of further AROM/isometrics of R foot.The patient would benefit from further skilled PT intervention to continue to progress towards goals as able.    Personal Factors and Comorbidities  Age    Examination-Activity Limitations  Squat;Stairs;Carry;Locomotion Level    Stability/Clinical Decision Making  Stable/Uncomplicated    Rehab Potential  Good    PT Frequency  2x / week    PT Duration  6 weeks    PT Treatment/Interventions  Therapeutic exercise;Neuromuscular re-education;Therapeutic activities;Gait training;Stair training;Functional mobility training;Balance training;Patient/family education;Manual techniques;Scar mobilization;Dry needling;Aquatic Therapy;Electrical Stimulation;Iontophoresis 4mg /ml Dexamethasone;Ultrasound    PT Next Visit Plan  glute med and max strengthening, femoral control, manual techniques,  modalities PRN    Consulted and Agree with Plan of Care  Patient  Patient will benefit from skilled therapeutic intervention in order to improve the following deficits and impairments:  Pain, Postural dysfunction, Improper body mechanics, Difficulty walking, Decreased strength, Decreased scar mobility, Decreased range of motion, Decreased activity tolerance, Abnormal gait  Visit Diagnosis: Pain in right ankle and joints of right foot  Difficulty in walking, not elsewhere classified     Problem List Patient Active Problem List   Diagnosis Date Noted  . Surgery follow-up examination 09/27/2018  . TOBACCO ABUSE 02/08/2007    Lieutenant Diego PT, DPT 12:30 PM,12/11/18 [phone]  [facility] PHYSICAL AND SPORTS MEDICINE [address], [place], [address] Phone: [phone]   Fax:  [phone]  Name: [name] MRN: [id number] Date of Birth: [birth year]

## 2018-12-13 ENCOUNTER — Other Ambulatory Visit: Payer: Self-pay

## 2018-12-13 ENCOUNTER — Ambulatory Visit: Payer: Managed Care, Other (non HMO)

## 2018-12-13 DIAGNOSIS — R262 Difficulty in walking, not elsewhere classified: Secondary | ICD-10-CM

## 2018-12-13 DIAGNOSIS — M25571 Pain in right ankle and joints of right foot: Secondary | ICD-10-CM

## 2018-12-13 NOTE — Therapy (Signed)
[facility] [address] [place], [address] Phone: [phone]   Fax:  [phone]  Physical Therapy Treatment  Patient Details  Name: [name] MRN: [id number] Date of Birth: 03-Dec-1960 Referring Provider (PT): Gala Lewandowsky, DPM   Encounter Date: 12/13/2018  PT End of Session - 12/13/18 1347    Visit Number  5    Number of Visits  13    Date for PT Re-Evaluation  01/10/19    PT Start Time  1347    PT Stop Time  1429    PT Time Calculation (min)  42 min    Activity Tolerance  Patient tolerated treatment well    Behavior During Therapy  [facility] for tasks assessed/performed       No past medical history on file.  No past surgical history on file.  There were no vitals filed for this visit.  Subjective Assessment - 12/13/18 1348    Subjective  R heel is doing ok. No pain. The tenderness is better. Not completely gone but not as tender.    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 Prior to surgery, pt had heel and foot pain for a year. Was treated conservatively with stretches and injections which did not help. Used a knee scooter until 11/20/2018 which was discharged by her surgeon. Uses a rw to feel steady. Next follow up is on 12/25/2018.  Going to the beach 12/14/2018 for about a week. Pt said that her doctor told her to take her boot with her and for her to also wear New Balance or Asics if she is walking on sand. Ordered TEVA sandles for her to wear in the water.  Has not yet had PT for her procedure. Currently not working after the surgery. Works at [employer]. 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 No heavy lifting.    Patient Stated Goals  Get back to being able to get around. Be able to walk longer distances so she can get back to work.    Currently in Pain?  No/denies    Pain Score  0-No pain    Pain Onset  More than a month ago                                PT Education - 12/13/18 1448    Education Details  ther-ex    Person(s) Educated  Patient    Methods  Explanation;Demonstration;Tactile cues;Verbal cues    Comprehension  Returned demonstration;Verbalized understanding      Objective   No known latex band allergies  Next MD appointment 12/25/2018   Going to the beach tomorrow 12/14/2018 until Friday 12/21/2018  MedbridgeAccess Code: [email or web address]     Manual therapy  GentleSTM scar tissue to decrease stiffness, promote gentle movement.   Gentle STM plantar fascia and around heel      Therapeutic Exercise   without CAM boot  Prone gentle ankle DF 10x3 Prone gentle ankle IV/EV 10x3 Prone gentle ankle circles 10x3 clockwise and counterclockwise Prone gentle ankle DF isometrics 10x3 with 5 second holds  Prone gentle ankle inversion and eversion isometrics 10x5 seconds     With CAM boot on  Forward step up onto Air Ex pad R LE no UE assist 10x3  Lateral step up onto Air Ex pad without  UE assist 10x3  SLS on R LE  with L tip toe assist with 2 kg ball toss x30  Prone R glute max extension 10x5 second holds  Good glute max muscle use  Prone R hip extension R knee straight 10x2 to promote glute and hamstrings strength   Improved exercise technique, movement at target joints, use of target muscles after min to mod verbal, visual, tactile cues.    Response to treatment Good hip muscle use felt with exercises. No complain of pain throughout session.    Clinical impression Continued working on improving soft tissue mobility at scar tissue area and plantar fascia to promote movement. Also worked on gentle R ankle AROM to decrease symptoms. Continued working on R glute, quad and hamstring strengthening to promote ability to perform standing tasks with less difficulty. Pt tolerated session well without aggravation of symptoms. Pt will benefit from  continued skilled physical therapy services to improve strength and function.         PT Short Term Goals - 11/27/18 1759      PT SHORT TERM GOAL #1   Title  Patient wil be independent with her HEP to improve glute strength, ankle ROM, and function.    Time  3    Period  Weeks    Status  New    Target Date  12/20/18        PT Long Term Goals - 11/27/18 1800      PT LONG TERM GOAL #1   Title  Patient will be able to ambulate at least 100 ft without CAM boot and no AD, wearing tennis shoes without LOB independently to promote mobility.    Baseline  Pt currently able to ambulate short distances with CAM boot (11/27/2018).    Time  6    Period  Weeks    Status  New    Target Date  01/10/19      PT LONG TERM GOAL #2   Title  Pt will improve bilateral hip abduction and extension strength by at least 1/2 MMT to promote ability to ambulate with good mechanics at her feet.    Baseline  Hip abduction: 4/5 R and L, hip extension: 4/5 R and L (11/27/2018)    Time  6    Period  Weeks    Status  New    Target Date  01/10/19      PT LONG TERM GOAL #3   Title  Patient will improve R ankle DF AROM to at least 15 degrees to promote foot clearance during swing phase of gait.    Baseline  R ankle DF AROM 1 degree (11/27/2018)    Time  6    Period  Weeks    Status  New    Target Date  01/10/19            Plan - 12/13/18 1347    Clinical Impression Statement  Continued working on improving soft tissue mobility at scar tissue area and plantar fascia to promote movement. Also worked on gentle R ankle AROM to decrease symptoms. Continued working on R glute, quad and hamstring strengthening to promote ability to perform standing tasks with less difficulty. Pt tolerated session well without aggravation of symptoms. Pt will benefit from continued skilled physical therapy services to improve strength and function.    Personal Factors and Comorbidities  Age    Examination-Activity  Limitations  Squat;Stairs;Carry;Locomotion Level    Stability/Clinical Decision Making  Stable/Uncomplicated    Rehab Potential  Good    PT Frequency  2x / week    PT Duration  6 weeks    PT Treatment/Interventions  Therapeutic exercise;Neuromuscular re-education;Therapeutic activities;Gait training;Stair training;Functional mobility training;Balance training;Patient/family education;Manual techniques;Scar mobilization;Dry needling;Aquatic Therapy;Electrical Stimulation;Iontophoresis 4mg /ml Dexamethasone;Ultrasound    PT Next Visit Plan  glute med and max strengthening, femoral control, manual techniques, modalities PRN    Consulted and Agree with Plan of Care  Patient       Patient will benefit from skilled therapeutic intervention in order to improve the following deficits and impairments:  Pain, Postural dysfunction, Improper body mechanics, Difficulty walking, Decreased strength, Decreased scar mobility, Decreased range of motion, Decreased activity tolerance, Abnormal gait  Visit Diagnosis: Pain in right ankle and joints of right foot  Difficulty in walking, not elsewhere classified     Problem List Patient Active Problem List   Diagnosis Date Noted  . Surgery follow-up examination 09/27/2018  . TOBACCO ABUSE 02/08/2007     Loralyn FreshwaterMiguel Mavi Un PT, DPT  12/13/2018, 2:52 PM  [facility] [address] , [place], [address] Phone: [phone]   Fax:  [phone]  Name: [name] MRN: [id number] Date of Birth: January 05, 1961

## 2018-12-25 ENCOUNTER — Encounter: Payer: Self-pay | Admitting: Podiatry

## 2018-12-25 ENCOUNTER — Other Ambulatory Visit: Payer: Self-pay

## 2018-12-25 ENCOUNTER — Telehealth: Payer: Self-pay | Admitting: Podiatry

## 2018-12-25 ENCOUNTER — Ambulatory Visit (INDEPENDENT_AMBULATORY_CARE_PROVIDER_SITE_OTHER): Payer: Managed Care, Other (non HMO) | Admitting: Podiatry

## 2018-12-25 ENCOUNTER — Ambulatory Visit: Payer: Managed Care, Other (non HMO)

## 2018-12-25 DIAGNOSIS — M7661 Achilles tendinitis, right leg: Secondary | ICD-10-CM

## 2018-12-25 DIAGNOSIS — Z9889 Other specified postprocedural states: Secondary | ICD-10-CM

## 2018-12-25 NOTE — Telephone Encounter (Signed)
Returned call to patient and informed her that she can start driving today per Dr. Amalia Hailey instructions.

## 2018-12-25 NOTE — Telephone Encounter (Signed)
Wants to know when she can start driving? Was taken out of boot today.

## 2018-12-27 ENCOUNTER — Ambulatory Visit: Payer: Managed Care, Other (non HMO)

## 2018-12-27 NOTE — Progress Notes (Signed)
   Subjective:  Patient presents today status post retrocalcaneal exostectomy right. DOS: 09/20/2018. She states she is doing well. She denies any pain or modifying factors. She has been using the CAM boot and doing physical therapy as directed. Patient is here for further evaluation and treatment.   No past medical history on file.    Objective/Physical Exam Neurovascular status intact.  Skin incisions appear to be well coapted. No sign of infectious process noted. No dehiscence. No active bleeding noted. Moderate edema noted to the surgical extremity.  Assessment: 1. s/p retrocalcaneal exostectomy right. DOS: 09/20/2018   Plan of Care:  1. Patient was evaluated.  2. Discontinue physical therapy. Continue exercises at home.  3. Discontinue using CAM boot. Recommended good shoe gear. 4. Return to work on 01/07/2019 with restrictions.  5. Return to clinic in 6 weeks.   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     Edrick Kins, DPM [facility]  Dr. Edrick Kins, [address]                                        [place],  [address]                Office [phone]  Fax [phone]

## 2019-01-04 ENCOUNTER — Encounter: Payer: Self-pay | Admitting: Podiatry

## 2019-02-05 ENCOUNTER — Ambulatory Visit (INDEPENDENT_AMBULATORY_CARE_PROVIDER_SITE_OTHER): Payer: Managed Care, Other (non HMO)

## 2019-02-05 ENCOUNTER — Ambulatory Visit (INDEPENDENT_AMBULATORY_CARE_PROVIDER_SITE_OTHER): Payer: Managed Care, Other (non HMO) | Admitting: Podiatry

## 2019-02-05 ENCOUNTER — Ambulatory Visit (INDEPENDENT_AMBULATORY_CARE_PROVIDER_SITE_OTHER): Payer: Managed Care, Other (non HMO) | Admitting: Orthopedic Surgery

## 2019-02-05 ENCOUNTER — Ambulatory Visit: Payer: Self-pay

## 2019-02-05 ENCOUNTER — Other Ambulatory Visit: Payer: Self-pay

## 2019-02-05 DIAGNOSIS — M25562 Pain in left knee: Secondary | ICD-10-CM

## 2019-02-05 DIAGNOSIS — M1712 Unilateral primary osteoarthritis, left knee: Secondary | ICD-10-CM | POA: Diagnosis not present

## 2019-02-05 DIAGNOSIS — M25561 Pain in right knee: Secondary | ICD-10-CM | POA: Diagnosis not present

## 2019-02-05 DIAGNOSIS — M1711 Unilateral primary osteoarthritis, right knee: Secondary | ICD-10-CM | POA: Diagnosis not present

## 2019-02-05 DIAGNOSIS — Z9889 Other specified postprocedural states: Secondary | ICD-10-CM

## 2019-02-05 DIAGNOSIS — M7661 Achilles tendinitis, right leg: Secondary | ICD-10-CM | POA: Diagnosis not present

## 2019-02-05 MED ORDER — LIDOCAINE HCL 1 % IJ SOLN
5.0000 mL | INTRAMUSCULAR | Status: AC | PRN
  Administered 2019-02-05: 17:00:00 5 mL

## 2019-02-05 MED ORDER — METHYLPREDNISOLONE ACETATE 40 MG/ML IJ SUSP
40.0000 mg | INTRAMUSCULAR | Status: AC | PRN
  Administered 2019-02-05: 17:00:00 40 mg via INTRA_ARTICULAR

## 2019-02-05 MED ORDER — LIDOCAINE HCL 1 % IJ SOLN
5.0000 mL | INTRAMUSCULAR | Status: AC | PRN
  Administered 2019-02-05: 5 mL

## 2019-02-05 NOTE — Progress Notes (Signed)
Office Visit Note   Patient: [name]           Date of Birth: [birth year]           MRN: [id number] Visit Date: 02/05/2019              Requested by: Armando Gang, FNP [address] [place],  [place] [address] PCP: Armando Gang, FNP  Chief Complaint  Patient presents with  . Right Knee - Pain  . Left Knee - Pain      HPI: The patient is a 58 year old woman who presented complaining of chronic knee pain bilaterally.  This has been ongoing for many many years however this current episode has lasted about a week month it has been insidious, worsening.  She was out of work for surgery had returned to work about a month ago and has had worsening of her pain since.  She has sharp pain deep in her left knee describes this as constant and aching anterior knee pain on the right sometimes radiates down the proximal tibia.  She has been using Advil and ice without much relief.  This is worse with ambulation.   She has had issues for quite some time has had good relief in the past from cortisone as well as hyaluronic acid.  Last hyaluronic acid injection was many many years ago. Assessment & Plan: Visit Diagnoses:  1. Pain in both knees, unspecified chronicity   2. Primary osteoarthritis of left knee   3. Primary osteoarthritis of right knee     Plan: Depo-Medrol injection bilaterally for severe osteoarthritis bilateral knees.  She will follow-up in the office as needed.  We will proceed with ordering hyaluronic acid injection when her pain returns we will proceed with this injection.  She had great relief from hyaluronic acid many many years ago.  Follow-Up Instructions: Return if symptoms worsen or fail to improve.   Right Knee Exam   Muscle Strength  The patient has normal right knee strength.  Tenderness  The patient is experiencing tenderness in the patella.  Range of Motion  The patient has normal right knee ROM.  Tests  Varus: negative Valgus: negative  Other  Erythema: absent Effusion: no effusion present   Left Knee Exam   Muscle Strength  The patient has normal left knee strength.  Tenderness  The patient is experiencing tenderness in the patella.  Range of Motion  The patient has normal left knee ROM.  Tests  Varus: negative Valgus: negative  Other  Erythema: absent Effusion: no effusion present      Patient is alert, oriented, no adenopathy, well-dressed, normal affect, normal respiratory effort.   Imaging: Xr Knee 1-2 Views Left  Result Date: 02/05/2019 Radiographs of left knee show severe tricompartmental osteoarthritis. No acute finding.   Xr Knee 1-2 Views Right  Result Date: 02/05/2019 Radiographs of the right knee show severe tricompartmental osteoarthritis.  No images are attached to the encounter.  Labs: No results found for: HGBA1C, ESRSEDRATE, CRP, LABURIC, REPTSTATUS, GRAMSTAIN, CULT, LABORGA   Lab Results  Component Value Date   ALBUMIN 4.2 09/11/2018    No results found for: MG No results found for: VD25OH  No results found for: PREALBUMIN No flowsheet data found.   There is no height or weight on file to calculate BMI.  Orders:  Orders Placed This Encounter  Procedures  . XR Knee 1-2 Views Left  . XR Knee 1-2 Views Right   No orders of  the defined types were placed in this encounter.    Procedures: Large Joint Inj: bilateral knee on 02/05/2019 4:31 PM Indications: pain Details: 18 G 1.5 in needle, anteromedial approach Medications (Right): 5 mL lidocaine 1 %; 40 mg methylPREDNISolone acetate 40 MG/ML Medications (Left): 5 mL lidocaine 1 %; 40 mg methylPREDNISolone acetate 40 MG/ML Consent was given by the patient.      Clinical Data: No additional findings.  ROS:  All other systems negative, except as noted in the HPI. Review of Systems  Constitutional: Negative for chills and fever.  Musculoskeletal: Positive for arthralgias. Negative for gait problem.     Objective: Vital Signs: There were no vitals taken for this visit.  Specialty Comments:  No specialty comments available.  PMFS History: Patient Active Problem List   Diagnosis Date Noted  . Surgery follow-up examination 09/27/2018  . TOBACCO ABUSE 02/08/2007   No past medical history on file.  No family history on file.  No past surgical history on file. Social History   Occupational History  . Not on file  Tobacco Use  . Smoking status: Never Smoker  . Smokeless tobacco: Never Used  Substance and Sexual Activity  . Alcohol use: Not on file  . Drug use: Not on file  . Sexual activity: Not on file

## 2019-02-08 NOTE — Progress Notes (Signed)
   Subjective:  Patient presents today status post retrocalcaneal exostectomy right. DOS: 09/20/2018. She states she is doing well. She denies any pain or modifying factors. She has been wearing supportive shoes as directed. Patient is here for further evaluation and treatment.   No past medical history on file.    Objective/Physical Exam Neurovascular status intact.  Skin incisions appear to be well coapted. No sign of infectious process noted. No dehiscence. No active bleeding noted. Moderate edema noted to the surgical extremity.  Assessment: 1. s/p retrocalcaneal exostectomy right. DOS: 09/20/2018   Plan of Care:  1. Patient was evaluated.  2. May resume full activity with no restrictions.  3. Continue wearing New Balance shoes.  4. Return to clinic as needed.    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     Edrick Kins, DPM [facility]  Dr. Edrick Kins, [address]                                        [place], [place] [address]                Office [phone]  Fax [phone]

## 2019-02-15 ENCOUNTER — Telehealth: Payer: Self-pay | Admitting: Radiology

## 2019-02-15 ENCOUNTER — Encounter: Payer: Self-pay | Admitting: Radiology

## 2019-02-15 NOTE — Telephone Encounter (Signed)
New start, bil knees, Monovisc, Erin/Duda, no appt yet.

## 2019-02-19 NOTE — Telephone Encounter (Signed)
Submitted online [employer] for [employer], with Erin/Duda.  Pending VOB.

## 2019-02-20 NOTE — Telephone Encounter (Signed)
PA needed thru [place], will submit and follow up.

## 2019-02-22 NOTE — Telephone Encounter (Signed)
PA form faxed to [employer].

## 2019-02-27 NOTE — Telephone Encounter (Signed)
Please call and schedule appt with [name] for bil knees Monovisc injections- and tell her that she will have $120 copay and can pay this on date of service.  Thanks.  Christella Scheuermann auth # [id number], valid 02/22/19 thru 03/22/19. Buy and bill ok.

## 2019-03-19 NOTE — Telephone Encounter (Signed)
Called pt 2x and left vm to call back.

## 2019-04-22 ENCOUNTER — Telehealth: Payer: Self-pay

## 2019-04-22 NOTE — Telephone Encounter (Signed)
Patient called left voice mail requesting an extension on her handicap sticker.  Per Dr. Logan Bores verbal order, ok to extend for 1 month.   Please let her know when ready to pick up    Thanks!

## 2021-07-23 ENCOUNTER — Ambulatory Visit
Admission: EM | Admit: 2021-07-23 | Discharge: 2021-07-23 | Disposition: A | Discharge status: Home / Self Care | Payer: Managed Care, Other (non HMO) | Attending: Emergency Medicine | Admitting: Emergency Medicine

## 2021-07-23 ENCOUNTER — Encounter: Payer: Self-pay | Admitting: Emergency Medicine

## 2021-07-23 DIAGNOSIS — I10 Essential (primary) hypertension: Secondary | ICD-10-CM

## 2021-07-23 DIAGNOSIS — J209 Acute bronchitis, unspecified: Secondary | ICD-10-CM

## 2021-07-23 DIAGNOSIS — J0110 Acute frontal sinusitis, unspecified: Secondary | ICD-10-CM

## 2021-07-23 HISTORY — DX: Disorder of thyroid, unspecified: E07.9

## 2021-07-23 HISTORY — DX: Essential (primary) hypertension: I10

## 2021-07-23 MED ORDER — PREDNISONE 10 MG PO TABS: 40.0000 mg | ORAL_TABLET | Freq: Every day | ORAL | 0 refills | Status: AC

## 2021-07-23 MED ORDER — AZITHROMYCIN 250 MG PO TABS: 250.0000 mg | ORAL_TABLET | Freq: Every day | ORAL | 0 refills | Status: AC

## 2021-07-23 NOTE — Discharge Instructions (Addendum)
Continue using the albuterol inhaler.  Take the Zithromax and prednisone as directed.  Follow up with your primary care provider.   ? ?Your blood pressure is elevated today at 174/89; repeat 157/95.  Please have this rechecked by your primary care provider in 1-2 weeks.     ? ?

## 2021-07-23 NOTE — ED Triage Notes (Signed)
Pt here with dry cough and headache x 1 week.  ?

## 2021-07-23 NOTE — ED Provider Notes (Signed)
?[facility] ? ? ? ?CSN: [id number] ?Arrival date & time: 07/23/21  1743 ? ? ?  ? ?History   ?Chief Complaint ?Chief Complaint  ?Patient presents with  ? Cough  ? Headache  ? ? ?HPI ?[name] is a 61 y.o. female.  Patient presents with nonproductive cough, wheezing, frontal headache, sinus congestion, postnasal drip x1 week.  She states this is similar to previous episodes of bronchitis.  She has been using an albuterol inhaler.  She denies fever, chills, rash, shortness of breath, chest pain, or other symptoms.  Her medical history includes hypertension and thyroid disease. ? ?The history is provided by the patient.  ? ?Past Medical History:  ?Diagnosis Date  ? Hypertension   ? Thyroid disease   ? ? ?Patient Active Problem List  ? Diagnosis Date Noted  ? Surgery follow-up examination 09/27/2018  ? TOBACCO ABUSE 02/08/2007  ? ? ?History reviewed. No pertinent surgical history. ? ?OB History   ?No obstetric history on file. ?  ? ? ? ?Home Medications   ? ?Prior to Admission medications   ?Medication Sig Start Date End Date Taking? Authorizing Provider  ?azithromycin (ZITHROMAX) 250 MG tablet Take 1 tablet (250 mg total) by mouth daily. Take first 2 tablets together, then 1 every day until finished. 07/23/21  Yes Sharion Balloon, NP  ?predniSONE (DELTASONE) 10 MG tablet Take 4 tablets (40 mg total) by mouth daily for 5 days. 07/23/21 07/28/21 Yes Sharion Balloon, NP  ?ibuprofen (ADVIL,MOTRIN) 800 MG tablet Take by mouth.    [provider]  ?levofloxacin (LEVAQUIN) 750 MG tablet  09/04/17   [provider]  ?levothyroxine (SYNTHROID, LEVOTHROID) 150 MCG tablet  12/20/17   [provider]  ?lisinopril (PRINIVIL,ZESTRIL) 40 MG tablet  08/16/17   [provider]  ?oxyCODONE-acetaminophen (PERCOCET) 5-325 MG tablet Take 1 tablet by mouth every 6 (six) hours as needed for severe pain. 12/04/18   Edrick Kins, DPM  ?PROAIR HFA 108 [id number] Base) MCG/ACT inhaler  09/04/17   [provider]  ? ? ?Family History ?Family History  ?Family history unknown: Yes  ? ? ?Social History ?Social History  ? ?Tobacco Use  ? Smoking status: Never  ? Smokeless tobacco: Never  ?Substance Use Topics  ? Alcohol use: Not Currently  ? Drug use: Never  ? ? ? ?Allergies   ?Penicillins and Rofecoxib ? ? ?Review of Systems ?Review of Systems  ?Constitutional:  Negative for chills and fever.  ?HENT:  Negative for ear pain and sore throat.   ?Respiratory:  Positive for cough and wheezing. Negative for shortness of breath.   ?Cardiovascular:  Negative for chest pain and palpitations.  ?Gastrointestinal:  Negative for diarrhea and vomiting.  ?Skin:  Negative for color change and rash.  ?All other systems reviewed and are negative. ? ? ?Physical Exam ?Triage Vital Signs ?ED Triage Vitals  ?Enc Vitals Group  ?   BP   ?   Pulse   ?   Resp   ?   Temp   ?   Temp src   ?   SpO2   ?   Weight   ?   Height   ?   Head Circumference   ?   Peak Flow   ?   Pain Score   ?   Pain Loc   ?   Pain Edu?   ?   Excl. in [place]?   ? ?No data found. ? ?Updated  Vital Signs ?BP (!) 157/95   Pulse 94   Temp 98.8 ?F (37.1 ?C)   Resp 18   SpO2 94%  ? ?Visual Acuity ?Right Eye Distance:   ?Left Eye Distance:   ?Bilateral Distance:   ? ?Right Eye Near:   ?Left Eye Near:    ?Bilateral Near:    ? ?Physical Exam ?Vitals and nursing note reviewed.  ?Constitutional:   ?   General: She is not in acute distress. ?   Appearance: She is well-developed. She is not ill-appearing.  ?HENT:  ?   Right Ear: Tympanic membrane normal.  ?   Left Ear: Tympanic membrane normal.  ?   Nose: Nose normal.  ?   Mouth/Throat:  ?   Mouth: Mucous membranes are moist.  ?   Pharynx: Oropharynx is clear.  ?Cardiovascular:  ?   Rate and Rhythm: Normal rate and regular rhythm.  ?   Heart sounds: Normal heart sounds.  ?Pulmonary:  ?   Effort: Pulmonary effort is normal. No respiratory distress.  ?   Breath sounds: Wheezing present.  ?   Comments: Faint inspiratory and  expiratory wheezes throughout. ?Musculoskeletal:  ?   Cervical back: Neck supple.  ?Skin: ?   General: Skin is warm and dry.  ?   Capillary Refill: Capillary refill takes less than 2 seconds.  ?Neurological:  ?   Mental Status: She is alert.  ?Psychiatric:     ?   Mood and Affect: Mood normal.     ?   Behavior: Behavior normal.  ? ? ? ?UC Treatments / Results  ?Labs ?(all labs ordered are listed, but only abnormal results are displayed) ?Labs Reviewed - No data to display ? ?EKG ? ? ?Radiology ?No results found. ? ?Procedures ?Procedures (including critical care time) ? ?Medications Ordered in UC ?Medications - No data to display ? ?Initial Impression / Assessment and Plan / UC Course  ?I have reviewed the triage vital signs and the nursing notes. ? ?Pertinent labs & imaging results that were available during my care of the patient were reviewed by me and considered in my medical decision making (see chart for details). ? ?  ?Acute bronchitis, acute sinusiti.  Elevated blood pressure reading with hypertension.  No respiratory distress but patient has faint inspiratory and expiratory wheezing throughout.  She has an albuterol inhaler that she has been using.  Treating today with prednisone and Zithromax.  Instructed her to follow-up with her PCP.  Also discussed with patient that her blood pressure is elevated today and needs to be rechecked by PCP in 1-2 weeks.  Education provided on managing hypertension.  She agrees to plan of care. ? ?Final Clinical Impressions(s) / UC Diagnoses  ? ?Final diagnoses:  ?Acute bronchitis, unspecified organism  ?Elevated blood pressure reading in office with diagnosis of hypertension  ?Acute non-recurrent frontal sinusitis  ? ? ? ?Discharge Instructions   ? ?  ?Continue using the albuterol inhaler.  Take the Zithromax and prednisone as directed.  Follow up with your primary care provider.   ? ?Your blood pressure is elevated today at 174/89; repeat 157/95.  Please have this rechecked  by your primary care provider in 1-2 weeks.     ? ? ? ? ? ?ED Prescriptions   ? ? Medication Sig Dispense Auth. Provider  ? predniSONE (DELTASONE) 10 MG tablet Take 4 tablets (40 mg total) by mouth daily for 5 days. 20 tablet Sharion Balloon, NP  ? azithromycin [facility])  250 MG tablet Take 1 tablet (250 mg total) by mouth daily. Take first 2 tablets together, then 1 every day until finished. 6 tablet Sharion Balloon, NP  ? ?  ? ?PDMP not reviewed this encounter. ?  ?Sharion Balloon, NP ?07/23/21 1854 ? ?

## 2023-03-01 ENCOUNTER — Other Ambulatory Visit: Payer: Self-pay | Admitting: Family Medicine

## 2023-03-01 DIAGNOSIS — Z122 Encounter for screening for malignant neoplasm of respiratory organs: Secondary | ICD-10-CM

## 2023-03-29 ENCOUNTER — Ambulatory Visit: Payer: Managed Care, Other (non HMO)

## 2023-03-29 DIAGNOSIS — Z1211 Encounter for screening for malignant neoplasm of colon: Secondary | ICD-10-CM | POA: Diagnosis present

## 2023-03-29 DIAGNOSIS — D125 Benign neoplasm of sigmoid colon: Secondary | ICD-10-CM | POA: Diagnosis not present

## 2023-03-29 DIAGNOSIS — K5730 Diverticulosis of large intestine without perforation or abscess without bleeding: Secondary | ICD-10-CM | POA: Diagnosis not present

## 2023-03-29 DIAGNOSIS — K640 First degree hemorrhoids: Secondary | ICD-10-CM | POA: Diagnosis not present

## 2023-03-29 DIAGNOSIS — K621 Rectal polyp: Secondary | ICD-10-CM | POA: Diagnosis not present
